# Patient Record
Sex: Female | Born: 1990
Health system: Southern US, Community
[De-identification: ages and names within clinical notes are randomized; demographics above are authoritative.]

## PROBLEM LIST (undated history)

## (undated) ENCOUNTER — Emergency Department (HOSPITAL_COMMUNITY): Discharge status: Against Medical Advice | Payer: Self-pay

## (undated) DIAGNOSIS — K5909 Other constipation: Secondary | ICD-10-CM

## (undated) DIAGNOSIS — E221 Hyperprolactinemia: Secondary | ICD-10-CM

## (undated) DIAGNOSIS — N926 Irregular menstruation, unspecified: Secondary | ICD-10-CM

## (undated) DIAGNOSIS — E049 Nontoxic goiter, unspecified: Secondary | ICD-10-CM

## (undated) HISTORY — DX: Nontoxic goiter, unspecified: E04.9

## (undated) HISTORY — DX: Hyperprolactinemia: E22.1

## (undated) HISTORY — DX: Irregular menstruation, unspecified: N92.6

## (undated) HISTORY — PX: NO PAST SURGERIES: SHX2092

## (undated) HISTORY — DX: Other constipation: K59.09

---
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
## 2011-04-15 ENCOUNTER — Ambulatory Visit (INDEPENDENT_AMBULATORY_CARE_PROVIDER_SITE_OTHER): Payer: Worker's Compensation | Admitting: Sports Medicine

## 2011-04-15 VITALS — BP 110/62 | Ht 64.0 in | Wt 126.0 lb

## 2011-04-15 DIAGNOSIS — S83006A Unspecified dislocation of unspecified patella, initial encounter: Secondary | ICD-10-CM

## 2011-04-15 DIAGNOSIS — S83003A Unspecified subluxation of unspecified patella, initial encounter: Secondary | ICD-10-CM

## 2011-04-15 NOTE — Progress Notes (Signed)
  Subjective:    Patient ID: Stacy Stevenson, female    DOB: 03/25/1990, 21 y.o.   MRN: [id number]  HPI 21 year old female presents with left knee pain x 6 weeks.  The pain started while lifting a patient a work on 03/03/11.  She reports that the knee felt like it hyperextended as she was lifting a patient.  The knee swelled and was painful.  She was evaluated at occupational health and was told to use Ibuprofen for pain.  She has been taking 400 mg Ibuprofen in the afternoon prior to going to work and this has helped some, but the knee is still somewhat swollen and painful.  She is able to walk up and down stairs without difficulty.  She does not exercise.  She has no prior injury to the left knee.     Review of Systems     Objective:   Physical Exam Gen: pleasant young woman in NAD Left knee: ttp along the medial patellar border, slight fullness of the suprapatellar pouch, decreased ROM of the left knee with almost complete extension but flexion to only 130 degrees as compared to 160 degrees on the right.  Negative Lachman's.  Intact MCL and LCL to stress testing.  +patellar apprehension.  In-office left knee ultrasound: + knee effusion with fluid tracking superiorly 4-5 cm along the quad tendon.  Irregularity of the medial patella. Patellar and quad tendons normal;  Meniscus normal. Comparison on RT knee shows no effusion.     Assessment & Plan:  21 year old female with left patellar subluxation and injury on 03/03/11 with persistent knee effusion.  Plan: - Compression with body helix knee sleeve when ambulating - Continue Ibuprofen - Straight leg raises and isometric quad flexion exercises given - Recheck in 4 weeks

## 2011-04-15 NOTE — Assessment & Plan Note (Signed)
See description  This patient is not at MMI Still with effusions Able to work but needs to be cautious with knee bend or too extreme lifting  Reck in 4 weeks

## 2011-05-13 ENCOUNTER — Ambulatory Visit (INDEPENDENT_AMBULATORY_CARE_PROVIDER_SITE_OTHER): Payer: Worker's Compensation | Admitting: Sports Medicine

## 2011-05-13 VITALS — BP 116/70

## 2011-05-13 DIAGNOSIS — S83006A Unspecified dislocation of unspecified patella, initial encounter: Secondary | ICD-10-CM

## 2011-05-13 DIAGNOSIS — S83003A Unspecified subluxation of unspecified patella, initial encounter: Secondary | ICD-10-CM

## 2011-05-13 NOTE — Assessment & Plan Note (Signed)
Today the ultrasound has returned to normal with the exception of the slight patellar irregularity  She may be at higher risk of patellar subluxation because her retropatellar groove is very shallow  Continue quadriceps exercises and some biking  She is at a point of maximum medical improvement and can be released to all work and recreational activities  Return to sports medicine when necessary

## 2011-05-13 NOTE — Patient Instructions (Addendum)
No sign of effusion or persistent injury today.  You are released for full activity and work duties.  Based on ultrasound and examination you are at the point of maximum medical improvement from previous injury.  If your knee bothers you it is ok to wear your knee sleeve for a couple of hours at a time.   Biking is good for your knees.  Thank you for seeing [place] today.  Please follow up as needed.

## 2011-05-13 NOTE — Progress Notes (Signed)
Date of injury is 03/03/2011 Mechanism of injury is hyperextension of the left knee while lifting a patient  Pt here today to followup with her left knee pain which she states is 100% better. She states she has no pain at all during the day.  She did use the compression sleeve for one week and saw less swelling in her knees but did get some swelling in her ankles so she stopped using it  She has done some of her exercises and has been careful at work about lifting  She just worked a double shift without knee pain or swelling     Physical examination   No acute distress   Lt knee Exam:  Tight Lachman Ligaments stable Neg McMurray's  Full flexion and extension No apprehension sign on patella Patella non tender to motion No effusion noted  Musculoskeletal ultrasound Swelling that was noted in the left suprapatellar pouch has resolved Compared to the right side there is no increase Menisci are normal Patellar and quadriceps tendons are normal

## 2012-04-30 ENCOUNTER — Emergency Department (INDEPENDENT_AMBULATORY_CARE_PROVIDER_SITE_OTHER)
Admission: EM | Admit: 2012-04-30 | Discharge: 2012-04-30 | Disposition: A | Discharge status: Home / Self Care | Payer: Self-pay | Source: Home / Self Care | Attending: Emergency Medicine | Admitting: Emergency Medicine

## 2012-04-30 ENCOUNTER — Encounter (HOSPITAL_COMMUNITY): Payer: Self-pay | Admitting: Emergency Medicine

## 2012-04-30 DIAGNOSIS — B353 Tinea pedis: Secondary | ICD-10-CM

## 2012-04-30 DIAGNOSIS — L0390 Cellulitis, unspecified: Secondary | ICD-10-CM

## 2012-04-30 MED ORDER — SULFAMETHOXAZOLE-TMP DS 800-160 MG PO TABS: 1.0000 | ORAL_TABLET | Freq: Two times a day (BID) | ORAL | Status: DC

## 2012-04-30 MED ORDER — HYDROCODONE-ACETAMINOPHEN 5-325 MG PO TABS: ORAL_TABLET | ORAL | Status: DC

## 2012-04-30 MED ORDER — IBUPROFEN 800 MG PO TABS
ORAL_TABLET | ORAL | Status: AC
  Filled 2012-04-30: qty 1

## 2012-04-30 MED ORDER — TERBINAFINE HCL 1 % EX CREA: TOPICAL_CREAM | Freq: Two times a day (BID) | CUTANEOUS | Status: DC

## 2012-04-30 MED ORDER — IBUPROFEN 800 MG PO TABS
800.0000 mg | ORAL_TABLET | Freq: Once | ORAL | Status: AC
  Administered 2012-04-30: 800 mg via ORAL

## 2012-04-30 MED ORDER — CEFTRIAXONE SODIUM 1 G IJ SOLR
1.0000 g | Freq: Once | INTRAMUSCULAR | Status: AC
  Administered 2012-04-30: 1 g via INTRAMUSCULAR

## 2012-04-30 MED ORDER — HYDROCODONE-ACETAMINOPHEN 5-325 MG PO TABS
ORAL_TABLET | ORAL | Status: AC
  Filled 2012-04-30: qty 1

## 2012-04-30 MED ORDER — HYDROCODONE-ACETAMINOPHEN 5-325 MG PO TABS
1.0000 | ORAL_TABLET | Freq: Once | ORAL | Status: AC
  Administered 2012-04-30: 1 via ORAL

## 2012-04-30 MED ORDER — CEPHALEXIN 500 MG PO CAPS: 500.0000 mg | ORAL_CAPSULE | Freq: Three times a day (TID) | ORAL | Status: DC

## 2012-04-30 MED ORDER — CEFTRIAXONE SODIUM 1 G IJ SOLR
INTRAMUSCULAR | Status: AC
  Filled 2012-04-30: qty 10

## 2012-04-30 MED ORDER — LIDOCAINE HCL (PF) 1 % IJ SOLN
INTRAMUSCULAR | Status: AC
  Filled 2012-04-30: qty 5

## 2012-04-30 NOTE — ED Notes (Signed)
Pain and swelling foot for couple of days, sore between toes

## 2012-04-30 NOTE — ED Provider Notes (Signed)
Chief Complaint:   Chief Complaint  Patient presents with  . Recurrent Skin Infections    History of Present Illness:   Stacy Stevenson is a 22 year old female who has had swelling, erythema, heat, and tenderness over the dorsum of the left foot since yesterday. She has an athlete's foot-type infection between the web spaces of her second, third, and fourth toes. There is some cracking of the skin. She has a blister on her middle toe, and it seems to be the source of the infection. There's been no purulent drainage. She denies any fever, chills, or extension above the ankle. She has had athlete's foot before, but never had an infection like this.  Review of Systems:  Other than noted above, the patient denies any of the following symptoms: Systemic:  No fevers, chills, sweats, or aches.  No fatigue or tiredness. Musculoskeletal:  No joint pain, arthritis, bursitis, swelling, back pain, or neck pain. Neurological:  No muscular weakness, paresthesias, headache, or trouble with speech or coordination.  No dizziness.  PMFSH:  Past medical history, family history, social history, meds, and allergies were reviewed.  She takes oral birth control pills.  Physical Exam:   Vital signs:  BP 127/80  Pulse 85  Temp(Src) 98 F (36.7 C) (Oral)  Resp 16  SpO2 100% Gen:  Alert and oriented times 3.  In no distress. Musculoskeletal: There is erythema, swelling, and tenderness to palpation of the dorsum of the foot measuring 5 cm x 8 cm. There is a blister on the middle toe. She has maceration of the skin between the second, third, and fourth toes.  Otherwise, all joints had a full a ROM with no swelling, bruising or deformity.  No edema, pulses full. Extremities were warm and pink.  Capillary refill was brisk.  Skin:  Clear, warm and dry.  No rash. Neuro:  Alert and oriented times 3.  Muscle strength was normal.  Sensation was intact to light touch.        Procedure Note:  Verbal informed consent was  obtained from the patient.  Risks and benefits were outlined with the patient.  Patient understands and accepts these risks.  Identity of the patient was confirmed verbally and by armband.    Procedure was performed as follows:  The area of erythema was marked with a dermatographia plan the blister on the middle toe was prepped with alcohol and incised with a #11 needle. A small amount of clear, nonbloody, non-purulent fluid was obtained which was cultured. Antibiotic ointment was applied.  Patient tolerated the procedure well without any immediate complications.  Course in Urgent Care Center:   Was given Rocephin 1 g IM, Norco 5/325 one by mouth, and ibuprofen 800 mg by mouth.  Assessment:  The primary encounter diagnosis was Tinea pedis. A diagnosis of Cellulitis was also pertinent to this visit.  Plan:   1.  The following meds were prescribed:   Discharge Medication List as of 04/30/2012  7:38 PM    START taking these medications   Details  cephALEXin (KEFLEX) 500 MG capsule Take 1 capsule (500 mg total) by mouth 3 (three) times daily., Starting 04/30/2012, Until Discontinued, Normal    sulfamethoxazole-trimethoprim (BACTRIM DS) 800-160 MG per tablet Take 1 tablet by mouth 2 (two) times daily., Starting 04/30/2012, Until Discontinued, Normal    terbinafine (LAMISIL) 1 % cream Apply topically 2 (two) times daily., Starting 04/30/2012, Until Discontinued, Normal    HYDROcodone-acetaminophen (NORCO/VICODIN) 5-325 MG per tablet 1 to 2  tabs every 4 to 6 hours as needed for pain., Print       2.  The patient was instructed in symptomatic care, including rest and activity, elevation, application of ice and compression.  Appropriate handouts were given. 3.  The patient was told to return if becoming worse in any way, for a scheduled recheck in 48 hours, and given some red flag symptoms such as fever, worsening pain, or a red streak extending up the leg that would indicate earlier return.   4.  The  patient was told to follow up in 48 hours.    Reuben Likes, MD 04/30/12 [id number]

## 2012-05-02 ENCOUNTER — Encounter (HOSPITAL_COMMUNITY): Payer: Self-pay | Admitting: Emergency Medicine

## 2012-05-02 ENCOUNTER — Emergency Department (INDEPENDENT_AMBULATORY_CARE_PROVIDER_SITE_OTHER)
Admission: EM | Admit: 2012-05-02 | Discharge: 2012-05-02 | Disposition: A | Discharge status: Home / Self Care | Payer: Self-pay | Source: Home / Self Care | Attending: Family Medicine | Admitting: Family Medicine

## 2012-05-02 DIAGNOSIS — L0291 Cutaneous abscess, unspecified: Secondary | ICD-10-CM

## 2012-05-02 DIAGNOSIS — L0390 Cellulitis, unspecified: Secondary | ICD-10-CM

## 2012-05-02 NOTE — ED Notes (Signed)
Pt in for follow up of sore between toes of the left foot.  Pt voices no concerns. Area is feeling better

## 2012-05-02 NOTE — ED Provider Notes (Signed)
Chief Complaint:   Chief Complaint  Patient presents with  . Wound Check    follow of sore between toes of the left foot.     History of Present Illness:   Stacy Stevenson is a 22 year old female who returns for scheduled followup on cellulitis of the foot secondary to an athlete's foot infection. She was seen here 48 hours ago. She had a small blister on her middle toe and this was popped. The lesion was cultured and so far is not growing out anything. She was placed on cephalexin and Septra. The swelling, redness, and pain have all gone down. She's not had any fever. There still some maceration in between her toes and she is applying terbinafine cream.  Review of Systems:  Other than noted above, the patient denies any of the following symptoms: Systemic:  No fevers, chills, sweats, or aches.  No fatigue or tiredness. Musculoskeletal:  No joint pain, arthritis, bursitis, swelling, back pain, or neck pain. Neurological:  No muscular weakness, paresthesias, headache, or trouble with speech or coordination.  No dizziness.  PMFSH:  Past medical history, family history, social history, meds, and allergies were reviewed.   Physical Exam:   Vital signs:  BP 125/83  Pulse 81  Temp(Src) 99.6 F (37.6 C) (Oral)  Resp 17  SpO2 99%  LMP 04/01/2012 Gen:  Alert and oriented times 3.  In no distress. Musculoskeletal: Full a bit better. There is no swelling, erythema, or tenderness to palpation. She still does have some maceration of the skin between her toes.  Otherwise, all joints had a full a ROM with no swelling, bruising or deformity.  No edema, pulses full. Extremities were warm and pink.  Capillary refill was brisk.  Skin:  Clear, warm and dry.  No rash. Neuro:  Alert and oriented times 3.  Muscle strength was normal.  Sensation was intact to light touch.   Course in Urgent Care Center:   Some of the dead skin from the blister was no cough. Antibiotic ointment was applied and a Telfa dressing.  She was instructed in wound care.  Assessment:  The encounter diagnosis was Cellulitis.  Plan:   1.  The following meds were prescribed:   Discharge Medication List as of 05/02/2012  3:43 PM     She was told to finish up the antibiotics that she was given at her last visit.  2.  The patient was instructed in symptomatic care, including rest and activity, elevation, application of ice and compression.  Appropriate handouts were given. 3.  The patient was told to return if becoming worse in any way, if no better in 3 or 4 days, and given some red flag symptoms such as increasing pain, swelling, or fever that would indicate earlier return.   4.  The patient was told to follow up as needed if any worsening.    Reuben Likes, MD 05/02/12 [phone]

## 2012-05-03 LAB — CULTURE, ROUTINE-ABSCESS: Gram Stain: NONE SEEN

## 2012-05-05 ENCOUNTER — Telehealth (HOSPITAL_COMMUNITY): Payer: Self-pay | Admitting: *Deleted

## 2012-05-05 NOTE — ED Notes (Signed)
Abscess culture L foot: Mod. MRSA.  I called pt.  Pt. verified x 2 and given results.   Pt. told she was adequately treated with Bactrim DS. Pt. given the [facility] MRSA instructions. Pt. voiced understanding. She asked where she got it.  I told her I did not know.  She told me about getting something on her foot 2 yrs ago [place]. I told her that could have been when she got it, but not sure.  Vassie Moselle 05/05/2012

## 2013-10-25 ENCOUNTER — Other Ambulatory Visit (HOSPITAL_COMMUNITY)
Admission: RE | Admit: 2013-10-25 | Discharge: 2013-10-25 | Disposition: A | Discharge status: Home / Self Care | Payer: 59 | Source: Ambulatory Visit | Attending: Family Medicine | Admitting: Family Medicine

## 2013-10-25 ENCOUNTER — Other Ambulatory Visit: Payer: Self-pay | Admitting: Family Medicine

## 2013-10-25 DIAGNOSIS — Z01419 Encounter for gynecological examination (general) (routine) without abnormal findings: Secondary | ICD-10-CM | POA: Insufficient documentation

## 2013-10-27 LAB — CYTOLOGY - PAP

## 2014-05-16 ENCOUNTER — Ambulatory Visit
Admission: RE | Admit: 2014-05-16 | Discharge: 2014-05-16 | Disposition: A | Discharge status: Home / Self Care | Payer: 59 | Source: Ambulatory Visit | Attending: Family Medicine | Admitting: Family Medicine

## 2014-05-16 ENCOUNTER — Other Ambulatory Visit: Payer: Self-pay | Admitting: Family Medicine

## 2014-05-16 DIAGNOSIS — K5900 Constipation, unspecified: Secondary | ICD-10-CM

## 2014-06-13 ENCOUNTER — Other Ambulatory Visit: Payer: Self-pay | Admitting: Family Medicine

## 2014-06-13 DIAGNOSIS — N6310 Unspecified lump in the right breast, unspecified quadrant: Secondary | ICD-10-CM

## 2014-06-15 ENCOUNTER — Ambulatory Visit
Admission: RE | Admit: 2014-06-15 | Discharge: 2014-06-15 | Disposition: A | Discharge status: Home / Self Care | Payer: 59 | Source: Ambulatory Visit | Attending: Family Medicine | Admitting: Family Medicine

## 2014-06-15 DIAGNOSIS — N6310 Unspecified lump in the right breast, unspecified quadrant: Secondary | ICD-10-CM

## 2014-11-14 ENCOUNTER — Other Ambulatory Visit (HOSPITAL_COMMUNITY): Payer: Self-pay | Admitting: Family Medicine

## 2014-11-14 DIAGNOSIS — E049 Nontoxic goiter, unspecified: Secondary | ICD-10-CM

## 2014-11-20 ENCOUNTER — Other Ambulatory Visit (HOSPITAL_COMMUNITY): Payer: Self-pay

## 2014-11-21 ENCOUNTER — Ambulatory Visit (HOSPITAL_COMMUNITY)
Admission: RE | Admit: 2014-11-21 | Discharge: 2014-11-21 | Disposition: A | Discharge status: Home / Self Care | Payer: 59 | Source: Ambulatory Visit | Attending: Family Medicine | Admitting: Family Medicine

## 2014-11-21 DIAGNOSIS — E041 Nontoxic single thyroid nodule: Secondary | ICD-10-CM | POA: Insufficient documentation

## 2014-11-21 DIAGNOSIS — E049 Nontoxic goiter, unspecified: Secondary | ICD-10-CM | POA: Diagnosis not present

## 2015-03-30 DIAGNOSIS — L731 Pseudofolliculitis barbae: Secondary | ICD-10-CM | POA: Diagnosis not present

## 2015-03-30 DIAGNOSIS — L309 Dermatitis, unspecified: Secondary | ICD-10-CM | POA: Diagnosis not present

## 2015-04-19 DIAGNOSIS — Z309 Encounter for contraceptive management, unspecified: Secondary | ICD-10-CM | POA: Diagnosis not present

## 2015-04-19 DIAGNOSIS — J029 Acute pharyngitis, unspecified: Secondary | ICD-10-CM | POA: Diagnosis not present

## 2015-04-25 DIAGNOSIS — Z7251 High risk heterosexual behavior: Secondary | ICD-10-CM | POA: Diagnosis not present

## 2015-04-25 DIAGNOSIS — Z113 Encounter for screening for infections with a predominantly sexual mode of transmission: Secondary | ICD-10-CM | POA: Diagnosis not present

## 2015-05-15 DIAGNOSIS — R109 Unspecified abdominal pain: Secondary | ICD-10-CM | POA: Diagnosis not present

## 2015-05-15 DIAGNOSIS — K5900 Constipation, unspecified: Secondary | ICD-10-CM | POA: Diagnosis not present

## 2015-07-27 DIAGNOSIS — Z113 Encounter for screening for infections with a predominantly sexual mode of transmission: Secondary | ICD-10-CM | POA: Diagnosis not present

## 2015-07-27 DIAGNOSIS — Z1151 Encounter for screening for human papillomavirus (HPV): Secondary | ICD-10-CM | POA: Diagnosis not present

## 2015-07-27 DIAGNOSIS — Z3040 Encounter for surveillance of contraceptives, unspecified: Secondary | ICD-10-CM | POA: Diagnosis not present

## 2015-07-27 DIAGNOSIS — Z124 Encounter for screening for malignant neoplasm of cervix: Secondary | ICD-10-CM | POA: Diagnosis not present

## 2015-07-27 DIAGNOSIS — Z6827 Body mass index (BMI) 27.0-27.9, adult: Secondary | ICD-10-CM | POA: Diagnosis not present

## 2015-07-27 DIAGNOSIS — Z130 Encounter for screening for diseases of the blood and blood-forming organs and certain disorders involving the immune mechanism: Secondary | ICD-10-CM | POA: Diagnosis not present

## 2015-07-27 DIAGNOSIS — Z1389 Encounter for screening for other disorder: Secondary | ICD-10-CM | POA: Diagnosis not present

## 2015-07-27 DIAGNOSIS — Z01419 Encounter for gynecological examination (general) (routine) without abnormal findings: Secondary | ICD-10-CM | POA: Diagnosis not present

## 2015-07-27 DIAGNOSIS — N926 Irregular menstruation, unspecified: Secondary | ICD-10-CM | POA: Diagnosis not present

## 2015-08-16 DIAGNOSIS — R51 Headache: Secondary | ICD-10-CM | POA: Diagnosis not present

## 2015-08-22 DIAGNOSIS — R51 Headache: Secondary | ICD-10-CM | POA: Diagnosis not present

## 2015-09-25 DIAGNOSIS — Z113 Encounter for screening for infections with a predominantly sexual mode of transmission: Secondary | ICD-10-CM | POA: Diagnosis not present

## 2015-10-05 ENCOUNTER — Other Ambulatory Visit: Payer: Self-pay | Admitting: Obstetrics and Gynecology

## 2015-10-05 DIAGNOSIS — R947 Abnormal results of other endocrine function studies: Secondary | ICD-10-CM

## 2015-10-08 ENCOUNTER — Ambulatory Visit
Admission: RE | Admit: 2015-10-08 | Discharge: 2015-10-08 | Disposition: A | Discharge status: Home / Self Care | Payer: 59 | Source: Ambulatory Visit | Attending: Obstetrics and Gynecology | Admitting: Obstetrics and Gynecology

## 2015-10-08 DIAGNOSIS — R930 Abnormal findings on diagnostic imaging of skull and head, not elsewhere classified: Secondary | ICD-10-CM | POA: Diagnosis not present

## 2015-10-08 DIAGNOSIS — R947 Abnormal results of other endocrine function studies: Secondary | ICD-10-CM

## 2015-10-08 MED ORDER — GADOBENATE DIMEGLUMINE 529 MG/ML IV SOLN
10.0000 mL | Freq: Once | INTRAVENOUS | Status: AC | PRN
  Administered 2015-10-08: 10 mL via INTRAVENOUS

## 2015-10-16 DIAGNOSIS — R05 Cough: Secondary | ICD-10-CM | POA: Diagnosis not present

## 2015-11-14 DIAGNOSIS — Z0000 Encounter for general adult medical examination without abnormal findings: Secondary | ICD-10-CM | POA: Diagnosis not present

## 2015-11-19 DIAGNOSIS — N926 Irregular menstruation, unspecified: Secondary | ICD-10-CM | POA: Diagnosis not present

## 2015-11-22 DIAGNOSIS — H52223 Regular astigmatism, bilateral: Secondary | ICD-10-CM | POA: Diagnosis not present

## 2015-11-22 DIAGNOSIS — H5213 Myopia, bilateral: Secondary | ICD-10-CM | POA: Diagnosis not present

## 2015-12-12 DIAGNOSIS — E049 Nontoxic goiter, unspecified: Secondary | ICD-10-CM | POA: Diagnosis not present

## 2015-12-12 DIAGNOSIS — E221 Hyperprolactinemia: Secondary | ICD-10-CM | POA: Diagnosis not present

## 2015-12-13 ENCOUNTER — Other Ambulatory Visit (HOSPITAL_COMMUNITY): Payer: Self-pay | Admitting: Endocrinology

## 2015-12-13 DIAGNOSIS — E049 Nontoxic goiter, unspecified: Secondary | ICD-10-CM

## 2015-12-17 ENCOUNTER — Ambulatory Visit (HOSPITAL_COMMUNITY)
Admission: RE | Admit: 2015-12-17 | Discharge: 2015-12-17 | Disposition: A | Discharge status: Home / Self Care | Payer: 59 | Source: Ambulatory Visit | Attending: Endocrinology | Admitting: Endocrinology

## 2015-12-17 DIAGNOSIS — E042 Nontoxic multinodular goiter: Secondary | ICD-10-CM | POA: Diagnosis not present

## 2015-12-17 DIAGNOSIS — E049 Nontoxic goiter, unspecified: Secondary | ICD-10-CM

## 2016-02-18 DIAGNOSIS — R947 Abnormal results of other endocrine function studies: Secondary | ICD-10-CM | POA: Diagnosis not present

## 2016-03-10 DIAGNOSIS — R05 Cough: Secondary | ICD-10-CM | POA: Diagnosis not present

## 2016-03-25 DIAGNOSIS — Z3009 Encounter for other general counseling and advice on contraception: Secondary | ICD-10-CM | POA: Diagnosis not present

## 2016-04-07 ENCOUNTER — Ambulatory Visit
Admission: RE | Admit: 2016-04-07 | Discharge: 2016-04-07 | Disposition: A | Discharge status: Home / Self Care | Payer: 59 | Source: Ambulatory Visit | Attending: Family Medicine | Admitting: Family Medicine

## 2016-04-07 ENCOUNTER — Other Ambulatory Visit: Payer: Self-pay | Admitting: Family Medicine

## 2016-04-07 DIAGNOSIS — R05 Cough: Secondary | ICD-10-CM | POA: Diagnosis not present

## 2016-04-07 DIAGNOSIS — R053 Chronic cough: Secondary | ICD-10-CM

## 2016-04-10 DIAGNOSIS — M940 Chondrocostal junction syndrome [Tietze]: Secondary | ICD-10-CM | POA: Diagnosis not present

## 2016-04-10 DIAGNOSIS — J42 Unspecified chronic bronchitis: Secondary | ICD-10-CM | POA: Diagnosis not present

## 2016-04-10 DIAGNOSIS — J3089 Other allergic rhinitis: Secondary | ICD-10-CM | POA: Diagnosis not present

## 2016-06-03 DIAGNOSIS — K5900 Constipation, unspecified: Secondary | ICD-10-CM | POA: Diagnosis not present

## 2016-06-03 DIAGNOSIS — R05 Cough: Secondary | ICD-10-CM | POA: Diagnosis not present

## 2016-06-05 ENCOUNTER — Encounter: Payer: Self-pay | Admitting: Gastroenterology

## 2016-06-16 DIAGNOSIS — E221 Hyperprolactinemia: Secondary | ICD-10-CM | POA: Diagnosis not present

## 2016-06-26 DIAGNOSIS — Z3200 Encounter for pregnancy test, result unknown: Secondary | ICD-10-CM | POA: Diagnosis not present

## 2016-07-02 DIAGNOSIS — Z3201 Encounter for pregnancy test, result positive: Secondary | ICD-10-CM | POA: Diagnosis not present

## 2016-07-02 DIAGNOSIS — N911 Secondary amenorrhea: Secondary | ICD-10-CM | POA: Diagnosis not present

## 2016-07-10 ENCOUNTER — Ambulatory Visit: Payer: Self-pay | Admitting: Gastroenterology

## 2016-07-23 DIAGNOSIS — Z3401 Encounter for supervision of normal first pregnancy, first trimester: Secondary | ICD-10-CM | POA: Diagnosis not present

## 2016-07-23 DIAGNOSIS — Z3A10 10 weeks gestation of pregnancy: Secondary | ICD-10-CM | POA: Diagnosis not present

## 2016-07-23 DIAGNOSIS — Z113 Encounter for screening for infections with a predominantly sexual mode of transmission: Secondary | ICD-10-CM | POA: Diagnosis not present

## 2016-07-23 DIAGNOSIS — O200 Threatened abortion: Secondary | ICD-10-CM | POA: Diagnosis not present

## 2016-07-23 DIAGNOSIS — Z3689 Encounter for other specified antenatal screening: Secondary | ICD-10-CM | POA: Diagnosis not present

## 2016-07-23 DIAGNOSIS — O26891 Other specified pregnancy related conditions, first trimester: Secondary | ICD-10-CM | POA: Diagnosis not present

## 2016-07-23 LAB — OB RESULTS CONSOLE RPR: RPR: NONREACTIVE

## 2016-07-23 LAB — OB RESULTS CONSOLE ABO/RH: RH TYPE: POSITIVE

## 2016-07-23 LAB — OB RESULTS CONSOLE ANTIBODY SCREEN: Antibody Screen: NEGATIVE

## 2016-07-23 LAB — OB RESULTS CONSOLE GC/CHLAMYDIA
Chlamydia: NEGATIVE
Gonorrhea: NEGATIVE

## 2016-07-23 LAB — OB RESULTS CONSOLE HIV ANTIBODY (ROUTINE TESTING): HIV: NONREACTIVE

## 2016-07-23 LAB — OB RESULTS CONSOLE HEPATITIS B SURFACE ANTIGEN: HEP B S AG: NEGATIVE

## 2016-07-23 LAB — OB RESULTS CONSOLE RUBELLA ANTIBODY, IGM: Rubella: IMMUNE

## 2016-07-31 ENCOUNTER — Institutional Professional Consult (permissible substitution): Payer: Self-pay | Admitting: Internal Medicine

## 2016-08-01 DIAGNOSIS — Z3A12 12 weeks gestation of pregnancy: Secondary | ICD-10-CM | POA: Diagnosis not present

## 2016-08-01 DIAGNOSIS — Z3682 Encounter for antenatal screening for nuchal translucency: Secondary | ICD-10-CM | POA: Diagnosis not present

## 2016-09-15 DIAGNOSIS — Z3A18 18 weeks gestation of pregnancy: Secondary | ICD-10-CM | POA: Diagnosis not present

## 2016-09-15 DIAGNOSIS — O3412 Maternal care for benign tumor of corpus uteri, second trimester: Secondary | ICD-10-CM | POA: Diagnosis not present

## 2016-09-15 DIAGNOSIS — Z363 Encounter for antenatal screening for malformations: Secondary | ICD-10-CM | POA: Diagnosis not present

## 2016-10-07 DIAGNOSIS — E049 Nontoxic goiter, unspecified: Secondary | ICD-10-CM | POA: Diagnosis not present

## 2016-10-07 DIAGNOSIS — E221 Hyperprolactinemia: Secondary | ICD-10-CM | POA: Diagnosis not present

## 2016-10-07 DIAGNOSIS — Z331 Pregnant state, incidental: Secondary | ICD-10-CM | POA: Diagnosis not present

## 2016-10-16 DIAGNOSIS — Z3A23 23 weeks gestation of pregnancy: Secondary | ICD-10-CM | POA: Diagnosis not present

## 2016-10-16 DIAGNOSIS — O368390 Maternal care for abnormalities of the fetal heart rate or rhythm, unspecified trimester, not applicable or unspecified: Secondary | ICD-10-CM | POA: Diagnosis not present

## 2016-11-10 DIAGNOSIS — Z3492 Encounter for supervision of normal pregnancy, unspecified, second trimester: Secondary | ICD-10-CM | POA: Diagnosis not present

## 2016-11-10 DIAGNOSIS — Z3A26 26 weeks gestation of pregnancy: Secondary | ICD-10-CM | POA: Diagnosis not present

## 2016-11-10 DIAGNOSIS — O368320 Maternal care for abnormalities of the fetal heart rate or rhythm, second trimester, not applicable or unspecified: Secondary | ICD-10-CM | POA: Diagnosis not present

## 2016-11-20 DIAGNOSIS — Z3689 Encounter for other specified antenatal screening: Secondary | ICD-10-CM | POA: Diagnosis not present

## 2016-12-03 ENCOUNTER — Other Ambulatory Visit: Payer: Self-pay | Admitting: Endocrinology

## 2016-12-03 DIAGNOSIS — E221 Hyperprolactinemia: Secondary | ICD-10-CM

## 2017-01-12 DIAGNOSIS — Z3403 Encounter for supervision of normal first pregnancy, third trimester: Secondary | ICD-10-CM | POA: Diagnosis not present

## 2017-01-12 DIAGNOSIS — Z3A35 35 weeks gestation of pregnancy: Secondary | ICD-10-CM | POA: Diagnosis not present

## 2017-01-12 DIAGNOSIS — Z3685 Encounter for antenatal screening for Streptococcus B: Secondary | ICD-10-CM | POA: Diagnosis not present

## 2017-01-12 LAB — OB RESULTS CONSOLE GBS: STREP GROUP B AG: POSITIVE

## 2017-01-19 DIAGNOSIS — Z3403 Encounter for supervision of normal first pregnancy, third trimester: Secondary | ICD-10-CM | POA: Diagnosis not present

## 2017-01-19 DIAGNOSIS — R8290 Unspecified abnormal findings in urine: Secondary | ICD-10-CM | POA: Diagnosis not present

## 2017-01-19 DIAGNOSIS — Z3A36 36 weeks gestation of pregnancy: Secondary | ICD-10-CM | POA: Diagnosis not present

## 2017-02-10 ENCOUNTER — Telehealth (HOSPITAL_COMMUNITY): Payer: Self-pay | Admitting: *Deleted

## 2017-02-10 ENCOUNTER — Encounter (HOSPITAL_COMMUNITY): Payer: Self-pay | Admitting: *Deleted

## 2017-02-10 NOTE — Telephone Encounter (Signed)
Preadmission screen  

## 2017-02-12 ENCOUNTER — Inpatient Hospital Stay (HOSPITAL_COMMUNITY): Admission: AD | Admit: 2017-02-12 | Payer: 59 | Source: Ambulatory Visit | Admitting: Obstetrics and Gynecology

## 2017-02-14 ENCOUNTER — Encounter (HOSPITAL_COMMUNITY): Payer: Self-pay

## 2017-02-14 ENCOUNTER — Inpatient Hospital Stay (HOSPITAL_COMMUNITY): Payer: 59 | Admitting: Anesthesiology

## 2017-02-14 ENCOUNTER — Inpatient Hospital Stay (HOSPITAL_COMMUNITY)
Admission: RE | Admit: 2017-02-14 | Discharge: 2017-02-17 | DRG: 787 | Disposition: A | Discharge status: Home / Self Care | Payer: 59 | Source: Ambulatory Visit | Attending: Obstetrics and Gynecology | Admitting: Obstetrics and Gynecology

## 2017-02-14 DIAGNOSIS — Z3A00 Weeks of gestation of pregnancy not specified: Secondary | ICD-10-CM | POA: Diagnosis not present

## 2017-02-14 DIAGNOSIS — D573 Sickle-cell trait: Secondary | ICD-10-CM | POA: Diagnosis present

## 2017-02-14 DIAGNOSIS — O3413 Maternal care for benign tumor of corpus uteri, third trimester: Secondary | ICD-10-CM | POA: Diagnosis present

## 2017-02-14 DIAGNOSIS — O99824 Streptococcus B carrier state complicating childbirth: Secondary | ICD-10-CM | POA: Diagnosis present

## 2017-02-14 DIAGNOSIS — D259 Leiomyoma of uterus, unspecified: Secondary | ICD-10-CM | POA: Diagnosis present

## 2017-02-14 DIAGNOSIS — Z3A40 40 weeks gestation of pregnancy: Secondary | ICD-10-CM | POA: Diagnosis not present

## 2017-02-14 DIAGNOSIS — O9902 Anemia complicating childbirth: Principal | ICD-10-CM | POA: Diagnosis present

## 2017-02-14 DIAGNOSIS — Z98891 History of uterine scar from previous surgery: Secondary | ICD-10-CM

## 2017-02-14 DIAGNOSIS — Z3483 Encounter for supervision of other normal pregnancy, third trimester: Secondary | ICD-10-CM | POA: Diagnosis present

## 2017-02-14 LAB — TYPE AND SCREEN
ABO/RH(D): O POS
ANTIBODY SCREEN: NEGATIVE

## 2017-02-14 LAB — CBC
HEMATOCRIT: 33.1 % — AB (ref 36.0–46.0)
Hemoglobin: 11 g/dL — ABNORMAL LOW (ref 12.0–15.0)
MCH: 24.7 pg — ABNORMAL LOW (ref 26.0–34.0)
MCHC: 33.2 g/dL (ref 30.0–36.0)
MCV: 74.2 fL — ABNORMAL LOW (ref 78.0–100.0)
Platelets: 261 10*3/uL (ref 150–400)
RBC: 4.46 MIL/uL (ref 3.87–5.11)
RDW: 16.4 % — AB (ref 11.5–15.5)
WBC: 10 10*3/uL (ref 4.0–10.5)

## 2017-02-14 LAB — ABO/RH: ABO/RH(D): O POS

## 2017-02-14 MED ORDER — OXYTOCIN 40 UNITS IN LACTATED RINGERS INFUSION - SIMPLE MED: 2.5000 [IU]/h | INTRAVENOUS | Status: DC

## 2017-02-14 MED ORDER — DIPHENHYDRAMINE HCL 50 MG/ML IJ SOLN: 12.5000 mg | INTRAMUSCULAR | Status: DC | PRN

## 2017-02-14 MED ORDER — LACTATED RINGERS IV SOLN
500.0000 mL | Freq: Once | INTRAVENOUS | Status: AC
  Administered 2017-02-14: 500 mL via INTRAVENOUS

## 2017-02-14 MED ORDER — EPHEDRINE 5 MG/ML INJ
10.0000 mg | INTRAVENOUS | Status: DC | PRN
  Filled 2017-02-14: qty 2

## 2017-02-14 MED ORDER — SOD CITRATE-CITRIC ACID 500-334 MG/5ML PO SOLN
30.0000 mL | ORAL | Status: DC | PRN
  Administered 2017-02-15: 30 mL via ORAL
  Filled 2017-02-14: qty 15

## 2017-02-14 MED ORDER — TERBUTALINE SULFATE 1 MG/ML IJ SOLN
0.2500 mg | Freq: Once | INTRAMUSCULAR | Status: DC | PRN
  Filled 2017-02-14: qty 1

## 2017-02-14 MED ORDER — FENTANYL CITRATE (PF) 100 MCG/2ML IJ SOLN
100.0000 ug | Freq: Once | INTRAMUSCULAR | Status: AC
  Administered 2017-02-14: 100 ug via INTRAVENOUS
  Filled 2017-02-14: qty 2

## 2017-02-14 MED ORDER — MISOPROSTOL 25 MCG QUARTER TABLET
25.0000 ug | ORAL_TABLET | ORAL | Status: DC | PRN
  Administered 2017-02-14: 25 ug via VAGINAL
  Filled 2017-02-14 (×2): qty 1

## 2017-02-14 MED ORDER — BUTORPHANOL TARTRATE 1 MG/ML IJ SOLN
1.0000 mg | INTRAMUSCULAR | Status: DC | PRN
  Administered 2017-02-14: 1 mg via INTRAVENOUS
  Filled 2017-02-14: qty 1

## 2017-02-14 MED ORDER — OXYTOCIN 40 UNITS IN LACTATED RINGERS INFUSION - SIMPLE MED
1.0000 m[IU]/min | INTRAVENOUS | Status: DC
Start: 2017-02-14 — End: 2017-02-15
  Administered 2017-02-14: 2 m[IU]/min via INTRAVENOUS
  Filled 2017-02-14: qty 1000

## 2017-02-14 MED ORDER — LIDOCAINE HCL (PF) 1 % IJ SOLN
INTRAMUSCULAR | Status: DC | PRN
  Administered 2017-02-14: 2 mL
  Administered 2017-02-14: 5 mL
  Administered 2017-02-14: 3 mL

## 2017-02-14 MED ORDER — LACTATED RINGERS IV SOLN: 500.0000 mL | Freq: Once | INTRAVENOUS | Status: DC

## 2017-02-14 MED ORDER — OXYTOCIN BOLUS FROM INFUSION: 500.0000 mL | Freq: Once | INTRAVENOUS | Status: DC

## 2017-02-14 MED ORDER — PENICILLIN G POTASSIUM 5000000 UNITS IJ SOLR
5.0000 10*6.[IU] | Freq: Once | INTRAVENOUS | Status: AC
  Administered 2017-02-14: 5 10*6.[IU] via INTRAVENOUS
  Filled 2017-02-14: qty 5

## 2017-02-14 MED ORDER — PHENYLEPHRINE 40 MCG/ML (10ML) SYRINGE FOR IV PUSH (FOR BLOOD PRESSURE SUPPORT)
80.0000 ug | PREFILLED_SYRINGE | INTRAVENOUS | Status: DC | PRN
Start: 2017-02-14 — End: 2017-02-15
  Administered 2017-02-14: 80 ug via INTRAVENOUS
  Filled 2017-02-14: qty 5

## 2017-02-14 MED ORDER — FENTANYL 2.5 MCG/ML BUPIVACAINE 1/10 % EPIDURAL INFUSION (WH - ANES)
14.0000 mL/h | INTRAMUSCULAR | Status: DC | PRN
  Administered 2017-02-14 – 2017-02-15 (×3): 14 mL/h via EPIDURAL
  Filled 2017-02-14 (×3): qty 100

## 2017-02-14 MED ORDER — LIDOCAINE HCL (PF) 1 % IJ SOLN
30.0000 mL | INTRAMUSCULAR | Status: DC | PRN
  Filled 2017-02-14: qty 30

## 2017-02-14 MED ORDER — LACTATED RINGERS IV SOLN
INTRAVENOUS | Status: DC
  Administered 2017-02-14: 125 mL/h via INTRAVENOUS
  Administered 2017-02-14 – 2017-02-15 (×2): via INTRAVENOUS

## 2017-02-14 MED ORDER — OXYCODONE-ACETAMINOPHEN 5-325 MG PO TABS: 1.0000 | ORAL_TABLET | ORAL | Status: DC | PRN

## 2017-02-14 MED ORDER — OXYCODONE-ACETAMINOPHEN 5-325 MG PO TABS: 2.0000 | ORAL_TABLET | ORAL | Status: DC | PRN

## 2017-02-14 MED ORDER — LACTATED RINGERS IV SOLN
500.0000 mL | INTRAVENOUS | Status: DC | PRN
  Administered 2017-02-14: 1000 mL via INTRAVENOUS
  Administered 2017-02-15 (×2): 500 mL via INTRAVENOUS

## 2017-02-14 MED ORDER — PHENYLEPHRINE 40 MCG/ML (10ML) SYRINGE FOR IV PUSH (FOR BLOOD PRESSURE SUPPORT)
80.0000 ug | PREFILLED_SYRINGE | INTRAVENOUS | Status: DC | PRN
  Filled 2017-02-14: qty 5

## 2017-02-14 MED ORDER — ONDANSETRON HCL 4 MG/2ML IJ SOLN
4.0000 mg | Freq: Four times a day (QID) | INTRAMUSCULAR | Status: DC | PRN
  Filled 2017-02-14: qty 2

## 2017-02-14 MED ORDER — PHENYLEPHRINE 40 MCG/ML (10ML) SYRINGE FOR IV PUSH (FOR BLOOD PRESSURE SUPPORT)
80.0000 ug | PREFILLED_SYRINGE | INTRAVENOUS | Status: DC | PRN
  Filled 2017-02-14: qty 10
  Filled 2017-02-14: qty 5

## 2017-02-14 MED ORDER — ACETAMINOPHEN 325 MG PO TABS: 650.0000 mg | ORAL_TABLET | ORAL | Status: DC | PRN

## 2017-02-14 MED ORDER — PENICILLIN G POT IN DEXTROSE 60000 UNIT/ML IV SOLN
3.0000 10*6.[IU] | INTRAVENOUS | Status: DC
  Administered 2017-02-14 – 2017-02-15 (×6): 3 10*6.[IU] via INTRAVENOUS
  Filled 2017-02-14 (×9): qty 50

## 2017-02-14 NOTE — Anesthesia Pain Management Evaluation Note (Signed)
  CRNA Pain Management Visit Note  Patient: Stacy Stevenson, 27 y.o., female  "Hello I am a member of the anesthesia team at [facility]. We have an anesthesia team available at all times to provide care throughout the hospital, including epidural management and anesthesia for C-section. I don't know your plan for the delivery whether it a natural birth, water birth, IV sedation, nitrous supplementation, doula or epidural, but we want to meet your pain goals."   1.Was your pain managed to your expectations on prior hospitalizations?   No prior hospitalizations  2.What is your expectation for pain management during this hospitalization?     IV pain meds  3.How can we help you reach that goal? Support prn  Record the patient's initial score and the patient's pain goal.   Pain: 0  Pain Goal: 5 [facility] wants you to be able to say your pain was always managed very well.  [facility] 02/14/2017

## 2017-02-14 NOTE — Progress Notes (Signed)
Patient ID: Stacy Stevenson, female   DOB: 1990-06-23, 26 y.o.   MRN: [id number] Pt doing well with no complaints. Has been ambulating VSS Cat 1, 135 Ctxs q 68mins 3/70/-2  P: AROM with moderate amount of clear fluid noted      Augment with pitocin if indicated      Recheck in 3 hrs or prn      Anticipate svd

## 2017-02-14 NOTE — H&P (Signed)
Stacy Stevenson is a 27 y.o. female presenting for elective iol at term with favorable cervix. Pt is dated per 10week [place]. She declined essential panel and first trimester screen. Pt has ss trait but FOB tested neg. Baby had fetal arrythmia but nl ECHO. Prenatal care otherwise nl. GBS positive  OB History    Gravida Para Term Preterm AB Living   1             SAB TAB Ectopic Multiple Live Births                 Past Medical History:  Diagnosis Date  . Chronic constipation   . Goiter   . Hyperprolactinemia ([place])   . Irregular periods    Past Surgical History:  Procedure Laterality Date  . NO PAST SURGERIES     Family History: family history includes Cancer in her maternal grandmother and paternal grandmother; Diabetes in her maternal grandmother and paternal grandmother. Social History:  reports that  has never smoked. she has never used smokeless tobacco. She reports that she drinks alcohol. She reports that she does not use drugs.     Maternal Diabetes: No Genetic Screening: Declined Maternal Ultrasounds/Referrals: Normal Fetal Ultrasounds or other Referrals:  Fetal echo nl Maternal Substance Abuse:  No Significant Maternal Medications:  None Significant Maternal Lab Results:  Lab values include: Group B Strep positive Other Comments:  None  Review of Systems  Constitutional: Positive for malaise/fatigue. Negative for chills and fever.  Eyes: Negative for blurred vision.  Respiratory: Negative for shortness of breath.   Cardiovascular: Negative for chest pain.  Gastrointestinal: Negative for abdominal pain, heartburn, nausea and vomiting.  Genitourinary: Negative for dysuria.  Musculoskeletal: Positive for back pain.  Skin: Negative for itching and rash.  Neurological: Negative for dizziness.  Endo/Heme/Allergies: Does not bruise/bleed easily.  Psychiatric/Behavioral: Negative for depression, hallucinations, substance abuse and suicidal ideas. The patient is not  nervous/anxious.    Maternal Medical History:  Reason for admission: Nausea. Elective iol  Contractions: Frequency: rare.   Perceived severity is mild.    Fetal activity: Perceived fetal activity is normal.   Last perceived fetal movement was within the past hour.    Prenatal complications: no prenatal complications Prenatal Complications - Diabetes: none.    Dilation: 1.5 Effacement (%): 70 Station: -2 Exam by:: Dorinda Hill RN Blood pressure 134/79, pulse 88, temperature 98.6 F (37 C), temperature source Oral, resp. rate 16, height 5\' 4"  (1.626 m), weight 183 lb (83 kg), last menstrual period 05/17/2016. Maternal Exam:  Uterine Assessment: Contraction strength is mild.  Contraction frequency is rare.   Abdomen: Patient reports generalized tenderness.  Estimated fetal weight is AGA.   Fetal presentation: vertex  Introitus: Normal vulva. Vulva is negative for condylomata and lesion.  Normal vagina.  Vagina is negative for condylomata and discharge.  Pelvis: adequate for delivery.   Cervix: Cervix evaluated by digital exam.     Physical Exam  Constitutional: She appears well-developed and well-nourished.  Eyes: Pupils are equal, round, and reactive to light.  Neck: Normal range of motion.  Cardiovascular: Normal rate.  Respiratory: Effort normal.  GI: Soft. There is generalized tenderness.  Genitourinary: Vagina normal and uterus normal. Vulva exhibits no lesion. No vaginal discharge found.  Musculoskeletal: Normal range of motion.  Neurological: She is alert.  Skin: Skin is warm.  Psychiatric: She has a normal mood and affect. Her behavior is normal. Judgment and thought content normal.    Prenatal labs: ABO,  Rh: --/--/O POS (02/09 0800) Antibody: NEG (02/09 0800) Rubella: Immune (07/18 0000) RPR: Nonreactive (07/18 0000)  HBsAg: Negative (07/18 0000)  HIV: Non-reactive (07/18 0000)  GBS: Positive (01/07 0000)   Assessment/Plan: Prime at 48 2/7wks for  elective iol  Cytotec placed at 8am; recheck at 10am Pain control prn Anticipate svd   Stacy Stevenson Stacy Stevenson 02/14/2017, 9:56 AM

## 2017-02-14 NOTE — Anesthesia Preprocedure Evaluation (Signed)
Anesthesia Evaluation  Patient identified by MRN, date of birth, ID band Patient awake    Reviewed: Allergy & Precautions, Patient's Chart, lab work & pertinent test results  Airway Mallampati: II  TM Distance: >3 FB     Dental   Pulmonary neg pulmonary ROS,    Pulmonary exam normal        Cardiovascular negative cardio ROS Normal cardiovascular exam     Neuro/Psych negative neurological ROS     GI/Hepatic negative GI ROS, Neg liver ROS,   Endo/Other  negative endocrine ROS  Renal/GU negative Renal ROS     Musculoskeletal   Abdominal   Peds  Hematology  (+) anemia ,   Anesthesia Other Findings   Reproductive/Obstetrics (+) Pregnancy                             Lab Results  Component Value Date   WBC 10.0 02/14/2017   HGB 11.0 (L) 02/14/2017   HCT 33.1 (L) 02/14/2017   MCV 74.2 (L) 02/14/2017   PLT 261 02/14/2017   No results found for: CREATININE, BUN, NA, K, CL, CO2  Anesthesia Physical Anesthesia Plan  ASA: II  Anesthesia Plan: Epidural   Post-op Pain Management:    Induction:   PONV Risk Score and Plan: Treatment may vary due to age or medical condition  Airway Management Planned: Natural Airway  Additional Equipment:   Intra-op Plan:   Post-operative Plan:   Informed Consent: I have reviewed the patients History and Physical, chart, labs and discussed the procedure including the risks, benefits and alternatives for the proposed anesthesia with the patient or authorized representative who has indicated his/her understanding and acceptance.     Plan Discussed with:   Anesthesia Plan Comments:         Anesthesia Quick Evaluation

## 2017-02-14 NOTE — Progress Notes (Signed)
Patient ID: Stacy Stevenson, female   DOB: 1990-11-07, 27 y.o.   MRN: [id number] Pt recovered well from fetal deceleration due to pt hypotension earlier. Pitocin restarted and back to 43mus. Pt with no complaints VSS 125, cat 2 Ctxs q 39mins 5/80/-2  Plan: IUPC placed; progressing well          FSE placed earlier dislodged          Expectant mgmt

## 2017-02-14 NOTE — Progress Notes (Signed)
80 mcg of phenylephrine administered, pt asymptomatic

## 2017-02-14 NOTE — Anesthesia Procedure Notes (Signed)
Epidural Patient location during procedure: OB Start time: 02/14/2017 4:44 PM End time: 02/14/2017 4:50 PM  Staffing Anesthesiologist: Suzette Battiest, MD Performed: anesthesiologist   Preanesthetic Checklist Completed: patient identified, site marked, surgical consent, pre-op evaluation, timeout performed, IV checked, risks and benefits discussed and monitors and equipment checked  Epidural Patient position: sitting Prep: site prepped and draped and DuraPrep Patient monitoring: continuous pulse ox and blood pressure Approach: midline Location: L3-L4 Injection technique: LOR air  Needle:  Needle type: Tuohy  Needle gauge: 17 G Needle length: 9 cm and 9 Needle insertion depth: 6 cm Catheter type: closed end flexible Catheter size: 19 Gauge Catheter at skin depth: 11 cm Test dose: negative  Assessment Events: blood not aspirated, injection not painful, no injection resistance, negative IV test and no paresthesia

## 2017-02-15 ENCOUNTER — Encounter (HOSPITAL_COMMUNITY): Payer: Self-pay

## 2017-02-15 ENCOUNTER — Encounter (HOSPITAL_COMMUNITY)
Admission: RE | Disposition: A | Discharge status: Home / Self Care | Payer: Self-pay | Source: Ambulatory Visit | Attending: Obstetrics and Gynecology

## 2017-02-15 DIAGNOSIS — Z98891 History of uterine scar from previous surgery: Secondary | ICD-10-CM

## 2017-02-15 LAB — RPR: RPR Ser Ql: NONREACTIVE

## 2017-02-15 SURGERY — Surgical Case
Anesthesia: Epidural | Site: Abdomen | Wound class: Clean Contaminated

## 2017-02-15 MED ORDER — MORPHINE SULFATE (PF) 0.5 MG/ML IJ SOLN
INTRAMUSCULAR | Status: DC | PRN
  Administered 2017-02-15: 4 mg via EPIDURAL

## 2017-02-15 MED ORDER — NALBUPHINE HCL 10 MG/ML IJ SOLN: 5.0000 mg | INTRAMUSCULAR | Status: DC | PRN

## 2017-02-15 MED ORDER — OXYCODONE HCL 5 MG PO TABS: 5.0000 mg | ORAL_TABLET | ORAL | Status: DC | PRN

## 2017-02-15 MED ORDER — CEFAZOLIN SODIUM-DEXTROSE 2-3 GM-%(50ML) IV SOLR
INTRAVENOUS | Status: DC | PRN
  Administered 2017-02-15: 2 g via INTRAVENOUS

## 2017-02-15 MED ORDER — COCONUT OIL OIL: 1.0000 "application " | TOPICAL_OIL | Status: DC | PRN

## 2017-02-15 MED ORDER — LACTATED RINGERS IV SOLN
INTRAVENOUS | Status: DC | PRN
  Administered 2017-02-15 (×2): via INTRAVENOUS

## 2017-02-15 MED ORDER — IBUPROFEN 600 MG PO TABS
600.0000 mg | ORAL_TABLET | Freq: Four times a day (QID) | ORAL | Status: DC
  Administered 2017-02-15 – 2017-02-17 (×7): 600 mg via ORAL
  Filled 2017-02-15 (×7): qty 1

## 2017-02-15 MED ORDER — PROMETHAZINE HCL 25 MG/ML IJ SOLN: 6.2500 mg | INTRAMUSCULAR | Status: DC | PRN

## 2017-02-15 MED ORDER — SODIUM CHLORIDE 0.9 % IR SOLN
Status: DC | PRN
  Administered 2017-02-15: 1

## 2017-02-15 MED ORDER — LACTATED RINGERS IV SOLN
INTRAVENOUS | Status: DC | PRN
  Administered 2017-02-15: 12:00:00 via INTRAVENOUS

## 2017-02-15 MED ORDER — DIBUCAINE 1 % RE OINT: 1.0000 "application " | TOPICAL_OINTMENT | RECTAL | Status: DC | PRN

## 2017-02-15 MED ORDER — OXYTOCIN 10 UNIT/ML IJ SOLN
INTRAMUSCULAR | Status: AC
  Filled 2017-02-15: qty 4

## 2017-02-15 MED ORDER — LACTATED RINGERS IV SOLN: INTRAVENOUS | Status: DC

## 2017-02-15 MED ORDER — SIMETHICONE 80 MG PO CHEW: 80.0000 mg | CHEWABLE_TABLET | ORAL | Status: DC

## 2017-02-15 MED ORDER — OXYCODONE HCL 5 MG PO TABS: 5.0000 mg | ORAL_TABLET | Freq: Once | ORAL | Status: DC | PRN

## 2017-02-15 MED ORDER — KETOROLAC TROMETHAMINE 30 MG/ML IJ SOLN
30.0000 mg | Freq: Once | INTRAMUSCULAR | Status: DC | PRN
  Administered 2017-02-15: 30 mg via INTRAVENOUS

## 2017-02-15 MED ORDER — MENTHOL 3 MG MT LOZG: 1.0000 | LOZENGE | OROMUCOSAL | Status: DC | PRN

## 2017-02-15 MED ORDER — PRENATAL MULTIVITAMIN CH
1.0000 | ORAL_TABLET | Freq: Every day | ORAL | Status: DC
  Administered 2017-02-16 – 2017-02-17 (×2): 1 via ORAL
  Filled 2017-02-15 (×2): qty 1

## 2017-02-15 MED ORDER — DIPHENHYDRAMINE HCL 50 MG/ML IJ SOLN: 12.5000 mg | INTRAMUSCULAR | Status: DC | PRN

## 2017-02-15 MED ORDER — NALBUPHINE HCL 10 MG/ML IJ SOLN: 5.0000 mg | Freq: Once | INTRAMUSCULAR | Status: DC | PRN

## 2017-02-15 MED ORDER — MISOPROSTOL 200 MCG PO TABS
ORAL_TABLET | ORAL | Status: AC
  Filled 2017-02-15: qty 4

## 2017-02-15 MED ORDER — ONDANSETRON HCL 4 MG/2ML IJ SOLN
INTRAMUSCULAR | Status: DC | PRN
  Administered 2017-02-15: 4 mg via INTRAVENOUS

## 2017-02-15 MED ORDER — SOD CITRATE-CITRIC ACID 500-334 MG/5ML PO SOLN: 30.0000 mL | ORAL | Status: DC

## 2017-02-15 MED ORDER — ONDANSETRON HCL 4 MG/2ML IJ SOLN
INTRAMUSCULAR | Status: AC
  Filled 2017-02-15: qty 2

## 2017-02-15 MED ORDER — OXYCODONE HCL 5 MG/5ML PO SOLN: 5.0000 mg | Freq: Once | ORAL | Status: DC | PRN

## 2017-02-15 MED ORDER — ACETAMINOPHEN 325 MG PO TABS: 650.0000 mg | ORAL_TABLET | ORAL | Status: DC | PRN

## 2017-02-15 MED ORDER — SENNOSIDES-DOCUSATE SODIUM 8.6-50 MG PO TABS
2.0000 | ORAL_TABLET | ORAL | Status: DC
  Administered 2017-02-15 – 2017-02-17 (×2): 2 via ORAL
  Filled 2017-02-15 (×2): qty 2

## 2017-02-15 MED ORDER — OXYTOCIN 40 UNITS IN LACTATED RINGERS INFUSION - SIMPLE MED: 2.5000 [IU]/h | INTRAVENOUS | Status: DC

## 2017-02-15 MED ORDER — SIMETHICONE 80 MG PO CHEW: 80.0000 mg | CHEWABLE_TABLET | Freq: Three times a day (TID) | ORAL | Status: DC

## 2017-02-15 MED ORDER — DEXAMETHASONE SODIUM PHOSPHATE 10 MG/ML IJ SOLN
INTRAMUSCULAR | Status: AC
  Filled 2017-02-15: qty 1

## 2017-02-15 MED ORDER — SODIUM BICARBONATE 8.4 % IV SOLN
INTRAVENOUS | Status: DC | PRN
  Administered 2017-02-15: 5 mL via EPIDURAL
  Administered 2017-02-15: 2 mL via EPIDURAL
  Administered 2017-02-15: 5 mL via EPIDURAL

## 2017-02-15 MED ORDER — OXYTOCIN 10 UNIT/ML IJ SOLN
INTRAVENOUS | Status: DC | PRN
  Administered 2017-02-15: 40 [IU] via INTRAVENOUS
  Administered 2017-02-15: 12:00:00 via INTRAVENOUS

## 2017-02-15 MED ORDER — DEXTROSE 5 % IV SOLN: 2.0000 g | INTRAVENOUS | Status: DC

## 2017-02-15 MED ORDER — DIPHENHYDRAMINE HCL 25 MG PO CAPS: 25.0000 mg | ORAL_CAPSULE | ORAL | Status: DC | PRN

## 2017-02-15 MED ORDER — TETANUS-DIPHTH-ACELL PERTUSSIS 5-2.5-18.5 LF-MCG/0.5 IM SUSP: 0.5000 mL | Freq: Once | INTRAMUSCULAR | Status: DC

## 2017-02-15 MED ORDER — SIMETHICONE 80 MG PO CHEW
80.0000 mg | CHEWABLE_TABLET | ORAL | Status: DC | PRN
  Administered 2017-02-15: 80 mg via ORAL
  Filled 2017-02-15: qty 1

## 2017-02-15 MED ORDER — MISOPROSTOL 25 MCG QUARTER TABLET
ORAL_TABLET | ORAL | Status: DC | PRN
  Administered 2017-02-15: 800 ug via RECTAL

## 2017-02-15 MED ORDER — SCOPOLAMINE 1 MG/3DAYS TD PT72
MEDICATED_PATCH | TRANSDERMAL | Status: AC
  Filled 2017-02-15: qty 1

## 2017-02-15 MED ORDER — OXYCODONE HCL 5 MG PO TABS: 10.0000 mg | ORAL_TABLET | ORAL | Status: DC | PRN

## 2017-02-15 MED ORDER — SODIUM CHLORIDE 0.9% FLUSH: 3.0000 mL | INTRAVENOUS | Status: DC | PRN

## 2017-02-15 MED ORDER — DIPHENHYDRAMINE HCL 25 MG PO CAPS: 25.0000 mg | ORAL_CAPSULE | Freq: Four times a day (QID) | ORAL | Status: DC | PRN

## 2017-02-15 MED ORDER — HYDROMORPHONE HCL 1 MG/ML IJ SOLN: 0.2500 mg | INTRAMUSCULAR | Status: DC | PRN

## 2017-02-15 MED ORDER — ONDANSETRON HCL 4 MG/2ML IJ SOLN: 4.0000 mg | Freq: Three times a day (TID) | INTRAMUSCULAR | Status: DC | PRN

## 2017-02-15 MED ORDER — NALOXONE HCL 4 MG/10ML IJ SOLN
1.0000 ug/kg/h | INTRAVENOUS | Status: DC | PRN
  Filled 2017-02-15: qty 5

## 2017-02-15 MED ORDER — SCOPOLAMINE 1 MG/3DAYS TD PT72
MEDICATED_PATCH | TRANSDERMAL | Status: DC | PRN
  Administered 2017-02-15: 1 via TRANSDERMAL

## 2017-02-15 MED ORDER — MORPHINE SULFATE (PF) 0.5 MG/ML IJ SOLN
INTRAMUSCULAR | Status: AC
  Filled 2017-02-15: qty 10

## 2017-02-15 MED ORDER — KETOROLAC TROMETHAMINE 30 MG/ML IJ SOLN
INTRAMUSCULAR | Status: AC
  Filled 2017-02-15: qty 1

## 2017-02-15 MED ORDER — MEPERIDINE HCL 25 MG/ML IJ SOLN: 6.2500 mg | INTRAMUSCULAR | Status: DC | PRN

## 2017-02-15 MED ORDER — SCOPOLAMINE 1 MG/3DAYS TD PT72
1.0000 | MEDICATED_PATCH | Freq: Once | TRANSDERMAL | Status: DC
  Filled 2017-02-15: qty 1

## 2017-02-15 MED ORDER — ZOLPIDEM TARTRATE 5 MG PO TABS: 5.0000 mg | ORAL_TABLET | Freq: Every evening | ORAL | Status: DC | PRN

## 2017-02-15 MED ORDER — NALOXONE HCL 0.4 MG/ML IJ SOLN: 0.4000 mg | INTRAMUSCULAR | Status: DC | PRN

## 2017-02-15 MED ORDER — DEXAMETHASONE SODIUM PHOSPHATE 10 MG/ML IJ SOLN
INTRAMUSCULAR | Status: DC | PRN
  Administered 2017-02-15: 10 mg via INTRAVENOUS

## 2017-02-15 MED ORDER — WITCH HAZEL-GLYCERIN EX PADS
1.0000 | MEDICATED_PAD | CUTANEOUS | Status: DC | PRN
Start: 2017-02-15 — End: 2017-02-17

## 2017-02-15 SURGICAL SUPPLY — 37 items
APL SKNCLS STERI-STRIP NONHPOA (GAUZE/BANDAGES/DRESSINGS) ×1
BENZOIN TINCTURE PRP APPL 2/3 (GAUZE/BANDAGES/DRESSINGS) ×1 IMPLANT
CHLORAPREP W/TINT 26ML (MISCELLANEOUS) ×2 IMPLANT
CLAMP CORD UMBIL (MISCELLANEOUS) IMPLANT
CLOTH BEACON ORANGE TIMEOUT ST (SAFETY) ×2 IMPLANT
DRAPE C SECTION CLR SCREEN (DRAPES) ×2 IMPLANT
DRSG OPSITE POSTOP 4X10 (GAUZE/BANDAGES/DRESSINGS) ×2 IMPLANT
ELECT REM PT RETURN 9FT ADLT (ELECTROSURGICAL) ×2
ELECTRODE REM PT RTRN 9FT ADLT (ELECTROSURGICAL) ×1 IMPLANT
EXTRACTOR VACUUM KIWI (MISCELLANEOUS) IMPLANT
GLOVE BIO SURGEON STRL SZ 6.5 (GLOVE) ×2 IMPLANT
GLOVE BIOGEL PI IND STRL 7.0 (GLOVE) ×2 IMPLANT
GLOVE BIOGEL PI INDICATOR 7.0 (GLOVE) ×2
GOWN STRL REUS W/TWL LRG LVL3 (GOWN DISPOSABLE) ×4 IMPLANT
HEMOSTAT ARISTA ABSORB 3G PWDR (MISCELLANEOUS) ×1 IMPLANT
KIT ABG SYR 3ML LUER SLIP (SYRINGE) IMPLANT
NDL HYPO 25X5/8 SAFETYGLIDE (NEEDLE) IMPLANT
NEEDLE HYPO 25X5/8 SAFETYGLIDE (NEEDLE) IMPLANT
NS IRRIG 1000ML POUR BTL (IV SOLUTION) ×2 IMPLANT
PACK C SECTION WH (CUSTOM PROCEDURE TRAY) ×2 IMPLANT
PAD OB MATERNITY 4.3X12.25 (PERSONAL CARE ITEMS) ×2 IMPLANT
RETRACTOR WND ALEXIS 25 LRG (MISCELLANEOUS) ×1 IMPLANT
RTRCTR C-SECT PINK 25CM LRG (MISCELLANEOUS) IMPLANT
RTRCTR WOUND ALEXIS 25CM LRG (MISCELLANEOUS) ×2
STRIP CLOSURE SKIN 1/2X4 (GAUZE/BANDAGES/DRESSINGS) ×1 IMPLANT
SUT CHROMIC 1 CTX 36 (SUTURE) ×4 IMPLANT
SUT PLAIN 0 NONE (SUTURE) IMPLANT
SUT PLAIN 2 0 XLH (SUTURE) ×2 IMPLANT
SUT VIC AB 0 CT1 27 (SUTURE) ×4
SUT VIC AB 0 CT1 27XBRD ANBCTR (SUTURE) ×2 IMPLANT
SUT VIC AB 2-0 CT1 27 (SUTURE) ×2
SUT VIC AB 2-0 CT1 TAPERPNT 27 (SUTURE) ×1 IMPLANT
SUT VIC AB 3-0 CT1 27 (SUTURE)
SUT VIC AB 3-0 CT1 TAPERPNT 27 (SUTURE) IMPLANT
SUT VIC AB 4-0 KS 27 (SUTURE) ×2 IMPLANT
TOWEL OR 17X24 6PK STRL BLUE (TOWEL DISPOSABLE) ×2 IMPLANT
TRAY FOLEY BAG SILVER LF 14FR (SET/KITS/TRAYS/PACK) ×2 IMPLANT

## 2017-02-15 NOTE — Progress Notes (Signed)
Patient ID: Stacy Stevenson, female   DOB: 10-Jun-1990, 27 y.o.   MRN: [id number] Cervix checked 60mins ago and unchanged from 5am- still 9cm dil Pt with no complaints T 100 Cat 1, 120, early decels Ctxs q 38mins; pit now at 4mus  P: Recheck in an hour       Discussed implications of arrest of dilation with pt and family. Still has inadequate mvus but due to cervical change made overnight pit was not increased; increase per protocol now

## 2017-02-15 NOTE — Progress Notes (Signed)
Patient ID: Stacy Stevenson, female   DOB: May 13, 1990, 27 y.o.   MRN: [id number] Pt doing well. No complaints VSS CAt 1, 120s, early decels noted Ctxs q 66mins 7/90/-1  A/P: Progressing well despite inadequate mvus - will increase pit per protocol          Continue to plan for svd

## 2017-02-15 NOTE — Progress Notes (Signed)
Patient ID: Stacy Stevenson, female   DOB: 10-Aug-1990, 27 y.o.   MRN: [id number] Ant lip still present on recheck Attempted to reduce with pushing unsuccessfully Pt expressed fatigue after 4 pushes. Fetal HR deceleration noted with pushing  Advised to proceed with primary cesarean section and reviewed risks and benefits and alternatives Pt consented Anesthesia and staff notified Stop pitocin ; strict NPO To OR when ready

## 2017-02-15 NOTE — Progress Notes (Signed)
Patient ID: Stacy Stevenson, female   DOB: 23-Jul-1990, 27 y.o.   MRN: [id number] Cervix 9.5cm but still -1 station Due to cervical change noted will recheck in an hour to see if progress noted  Pit now at 82mus Cat 1

## 2017-02-15 NOTE — Op Note (Signed)
Operative Note    Preoperative Diagnosis: IUP at term                                             Arrest of dilation   Postoperative Diagnosis: Same                                    Adherent placenta; mild uterine inversion   Procedure: Primary low transverse cesarean section with double layered closure   Surgeon: Terri Piedra, C DO  Anesthesia: Epidural  Fluids: LR 1800cc EBL: 593cc UOP: 200cc   Findings: Viable female infant in cephalic position with body cord; apgars 9,9; weight pending. Lower uterine segment inversion - easily replaced manually with no other interventions; adherent placenta. Grossly nl uterus ( 2cm ant fibroid), tubes and ovaries   Specimen: Placenta to pathology   Procedure Note Patient was taken to the operating room where epidural anesthesia was found to be adequate by Allis clamp test. She was prepped and draped in the normal sterile fashion in the dorsal supine position with a leftward tilt. An appropriate time out was performed. A Pfannenstiel skin incision was then made with the scalpel and carried through to the underlying layer of fascia by sharp dissection and Bovie cautery. The fascia was nicked in the midline and the incision was extended laterally with Mayo scissors. The inferior aspect of the incision was grasped Coker clamps and dissected off the underlying rectus muscles. In a similar fashion the superior aspect was dissected off the rectus muscles. Rectus muscles were separated in the midline and the peritoneal cavity entered bluntly. The peritoneal incision was then extended both superiorly and inferiorly with careful attention to avoid both bowel and bladder. The Alexis self-retaining wound retractor was then placed within the incision and the lower uterine segment exposed. The bladder flap was developed with Metzenbaum scissors and pushed away from the lower uterine segment. The lower uterine segment was then incised in a transverse fashion and the  cavity itself entered bluntly. The incision was extended bluntly. The infant's head was then lifted and delivered from the incision without difficulty. Bulb suction performed. The remainder of the infant delivered and the nose and mouth bulb suctioned again with the cord clamped and cut after a minute delay.  The infant was handed off to the waiting pediatricians. During delivery of the placenta, the lower uterine segment was noted to invert due to moderately adherent placenta. Trailing membranes in fundus also noted. The lower uterine segment was manually replaced after uterus exteriorized and all membranes removed. Arista applied with great hemostasis noted. Although the fundus was firm lower segment was boggy so 875mcg rectal cytotec placed at end of case.  The uterine incision was then repaired in 2 layers the first layer was a running locked layer 1-0 chromic and the second an imbricating layer of the same suture. Three figure 8 stitches were placed to stop bleeding due to boggy lower uterine segment in mid incision. The tubes and ovaries were inspected and the gutters cleared of all clots and debris.  The uterine incision was inspected and found to be hemostatic. All instruments and sponges as well as the Alexis retractor were then removed from the abdomen. The rectus muscles and peritoneum were then reapproximated in a running fashion.The fascia was  then closed with 0 Vicryl in a running fashion. The skin was closed with a subcuticular stitch of 4-0 Vicryl on a Keith needle and then reinforced with benzoin and Steri-Strips. At the conclusion of the procedure all instruments and sponge counts were correct. Patient was taken to the recovery room in good condition with her baby accompanying her skin to skin.

## 2017-02-15 NOTE — Transfer of Care (Signed)
Immediate Anesthesia Transfer of Care Note  Patient: Stacy Stevenson  Procedure(s) Performed: CESAREAN SECTION (N/A Abdomen)  Patient Location: PACU  Anesthesia Type:Epidural  Level of Consciousness: awake, alert  and oriented  Airway & Oxygen Therapy: Patient Spontanous Breathing  Post-op Assessment: Report given to RN and Post -op Vital signs reviewed and stable  Post vital signs: Reviewed and stable  Last Vitals:  Vitals:   02/15/17 1031 02/15/17 1131  BP: 123/85 (!) 146/55  Pulse: 95 (!) 115  Resp: 16   Temp:    SpO2:      Last Pain:  Vitals:   02/15/17 1031  TempSrc:   PainSc: 0-No pain      Patients Stated Pain Goal: 2 (35/36/14 4315)  Complications: No apparent anesthesia complications

## 2017-02-15 NOTE — Progress Notes (Signed)
Patient progressing well post-op. Orthostatics and initial ambulation with nurse done well. Patient state she had already stood up on her own prior without RN assistance and that she was fine. We discussed safety & fall prevention plan. Will continue to monitor. Jackson Latino, RN

## 2017-02-15 NOTE — Anesthesia Postprocedure Evaluation (Signed)
Anesthesia Post Note  Patient: Stacy Stevenson  Procedure(s) Performed: CESAREAN SECTION (N/A Abdomen)     Patient location during evaluation: PACU Anesthesia Type: Epidural Level of consciousness: awake and alert Pain management: pain level controlled Vital Signs Assessment: post-procedure vital signs reviewed and stable Respiratory status: spontaneous breathing, nonlabored ventilation and respiratory function stable Cardiovascular status: stable Postop Assessment: no headache, no backache and epidural receding Anesthetic complications: no    Last Vitals:  Vitals:   02/15/17 1830 02/15/17 2125  BP: 136/84   Pulse: 80   Resp: 18   Temp: 36.8 C   SpO2: 100% 100%    Last Pain:  Vitals:   02/15/17 2027  TempSrc:   PainSc: 1    Pain Goal: Patients Stated Pain Goal: 2 (02/14/17 1336)               Ryan P Ellender

## 2017-02-16 ENCOUNTER — Encounter (HOSPITAL_COMMUNITY): Payer: Self-pay | Admitting: Obstetrics and Gynecology

## 2017-02-16 LAB — CBC
HCT: 25.3 % — ABNORMAL LOW (ref 36.0–46.0)
Hemoglobin: 8.4 g/dL — ABNORMAL LOW (ref 12.0–15.0)
MCH: 24.3 pg — AB (ref 26.0–34.0)
MCHC: 33.2 g/dL (ref 30.0–36.0)
MCV: 73.1 fL — AB (ref 78.0–100.0)
PLATELETS: 246 10*3/uL (ref 150–400)
RBC: 3.46 MIL/uL — AB (ref 3.87–5.11)
RDW: 16.6 % — ABNORMAL HIGH (ref 11.5–15.5)
WBC: 25.1 10*3/uL — AB (ref 4.0–10.5)

## 2017-02-16 NOTE — Addendum Note (Signed)
Addendum  created 02/16/17 2493 by Hewitt Blade, CRNA   Charge Capture section accepted, Sign clinical note

## 2017-02-16 NOTE — Lactation Note (Signed)
This note was copied from a baby's chart. Lactation Consultation Note Baby 18 hrs. Mom stated baby wanted to BF a lot during the night. Mom has short shaft nipples, breast heavy. Hand expressed colostrum flows. Noted relieve of breast w/hand expression and baby BF. Nipple more compressible. Encourage mom to stimulate, compress nipples and areola to soften to make more compressible to obtain deeper latch.  Assess breast for transfer. Shells and hand pump given. Baby rooting, gets on the breast, not very aggressive. Discussed newborn behavior, feeding habits, STS, I&O, supply and demand.  Mom encouraged to feed baby 8-12 times/24 hours and with feeding cues. Mom encouraged to waken baby for feeds. Encouraged to call for assistance.  [place] brochure given w/resources, support groups and [place] services.  Patient Name: Boy Enrika Aguado BFXOV'A Date: 02/16/2017 Reason for consult: Initial assessment   Maternal Data Has patient been taught Hand Expression?: Yes Does the patient have breastfeeding experience prior to this delivery?: No  Feeding Feeding Type: Breast Fed Length of feed: 17 min(still BF)  LATCH Score Latch: Repeated attempts needed to sustain latch, nipple held in mouth throughout feeding, stimulation needed to elicit sucking reflex.  Audible Swallowing: A few with stimulation  Type of Nipple: Everted at rest and after stimulation(short shaft)  Comfort (Breast/Nipple): Soft / non-tender  Hold (Positioning): Assistance needed to correctly position infant at breast and maintain latch.  LATCH Score: 7  Interventions Interventions: Breast feeding basics reviewed;Assisted with latch;Breast compression;Shells;Skin to skin;Adjust position;Breast massage;Support pillows;Hand pump;Hand express;Position options;Expressed milk;Pre-pump if needed  Lactation Tools Discussed/Used Tools: Shells;Pump Shell Type: Inverted Breast pump type: Manual WIC Program: No Pump Review: Setup,  frequency, and cleaning;Milk Storage Initiated by:: Allayne Stack RN IBCLC Date initiated:: 02/16/17   Consult Status Consult Status: Follow-up Date: 02/16/17 Follow-up type: In-patient    Theodoro Kalata 02/16/2017, 6:39 AM

## 2017-02-16 NOTE — Progress Notes (Signed)
Subjective: Postpartum Day 1: Cesarean Delivery Patient reports incisional pain and tolerating PO.  Nl lochia, pain controlled  Objective: Vital signs in last 24 hours: Temp:  [98.2 F (36.8 C)-100.5 F (38.1 C)] 98.5 F (36.9 C) (02/11 0533) Pulse Rate:  [80-115] 83 (02/11 0533) Resp:  [16-22] 16 (02/11 0533) BP: (108-147)/(55-113) 108/68 (02/11 0533) SpO2:  [93 %-100 %] 99 % (02/11 0533)  Physical Exam:  General: alert and no distress Lochia: appropriate Uterine Fundus: firm Incision: healing well DVT Evaluation: No evidence of DVT seen on physical exam.  Recent Labs    02/14/17 0800 02/16/17 0502  HGB 11.0* 8.4*  HCT 33.1* 25.3*    Assessment/Plan: Status post Cesarean section. Doing well postoperatively.  Continue current care.  Stacy Stevenson 02/16/2017, 8:42 AM

## 2017-02-16 NOTE — Anesthesia Postprocedure Evaluation (Signed)
Anesthesia Post Note  Patient: Stacy Stevenson  Procedure(s) Performed: CESAREAN SECTION (N/A Abdomen)     Patient location during evaluation: Mother Baby Anesthesia Type: Epidural Level of consciousness: awake and alert Pain management: pain level controlled Vital Signs Assessment: post-procedure vital signs reviewed and stable Respiratory status: spontaneous breathing, nonlabored ventilation and respiratory function stable Cardiovascular status: stable Postop Assessment: no headache, no backache, epidural receding, patient able to bend at knees, adequate PO intake and no apparent nausea or vomiting Anesthetic complications: no    Last Vitals:  Vitals:   02/16/17 0305 02/16/17 0533  BP: 120/84 108/68  Pulse: 92 83  Resp: 18 16  Temp: 37.1 C 36.9 C  SpO2: 98% 99%    Last Pain:  Vitals:   02/16/17 0710  TempSrc:   PainSc: 0-No pain   Pain Goal: Patients Stated Pain Goal: 2 (02/14/17 1336)               Corbin Hott Hristova

## 2017-02-16 NOTE — Progress Notes (Signed)
CSW acknowledges consult, however a 9 on the EDPS does not warrant a consult for CSW.  MOB should have a score greater than 9/yes to question 10 on Edinburgh Postpartum Depression Screen for CSW consult.    Please contact the Clinical Social Worker if needs arise or by MOB's request.  Laurey Arrow, MSW, LCSW Clinical Social Work [phone]

## 2017-02-17 ENCOUNTER — Encounter (HOSPITAL_COMMUNITY): Payer: Self-pay | Admitting: *Deleted

## 2017-02-17 MED ORDER — IBUPROFEN 600 MG PO TABS: 600.0000 mg | ORAL_TABLET | Freq: Four times a day (QID) | ORAL | 0 refills | Status: DC

## 2017-02-17 MED ORDER — ACETAMINOPHEN 325 MG PO TABS: 650.0000 mg | ORAL_TABLET | ORAL | 1 refills | Status: DC | PRN

## 2017-02-17 NOTE — Progress Notes (Signed)
Subjective: Postpartum Day 2: Cesarean Delivery Patient reports incisional pain, tolerating PO and no problems voiding.  Pian controlled with ibuprofen and ambulating fine.  Objective: Vital signs in last 24 hours: Temp:  [98 F (36.7 C)-98.4 F (36.9 C)] 98 F (36.7 C) (02/12 0650) Pulse Rate:  [79-92] 85 (02/12 0650) Resp:  [17-18] 18 (02/12 0650) BP: (108-113)/(51-61) 113/55 (02/12 0650) SpO2:  [98 %] 98 % (02/11 0930)  Physical Exam:  General: alert and cooperative Lochia: appropriate Uterine Fundus: firm Incision: C/D/I   Recent Labs    02/16/17 0502  HGB 8.4*  HCT 25.3*    Assessment/Plan: Status post Cesarean section. Doing well postoperatively. d/c home later this PM Relative anemia, but tolerating fine.  Will start po iron  Discharge home with standard precautions and return to office in 2 weeks.  Logan Bores 02/17/2017, 8:53 AM

## 2017-02-17 NOTE — Lactation Note (Signed)
Lactation Consultation Note  Patient Name: Stacy Stevenson [id number] Date: 02/17/2017  Mom states baby has been cluster feeding.  Reassured.  Instructed to continue to feed with cues.  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  Lactation outpatient services encouraged prn.   Maternal Data    Feeding    LATCH Score                   Interventions    Lactation Tools Discussed/Used     Consult Status      Ave Filter 02/17/2017, 12:12 PM

## 2017-02-17 NOTE — Discharge Summary (Signed)
OB Discharge Summary     Patient Name: Stacy Stevenson DOB: July 27, 1990 MRN: [id number]  Date of admission: 02/14/2017 Delivering MD: Carlynn Purl [facility]   Date of discharge: 02/17/2017  Admitting diagnosis: 40 wk induction Intrauterine pregnancy: [redacted]w[redacted]d     Secondary diagnosis:  Active Problems:   Postpartum care following cesarean delivery   Arrest of dilation, delivered, current hospitalization   Status post primary low transverse cesarean section  Additional problems: arrest of dilation     Discharge diagnosis: Term Pregnancy Delivered                                                                                                Post partum procedures:none  Augmentation: AROM, Pitocin and Cytotec  Complications: Uterine Inversion--mild at time of c-section, easily reduced  Hospital course:  Induction of Labor With Cesarean Section  27 y.o. yo G1P0 at [redacted]w[redacted]d was admitted to the hospital 02/14/2017 for induction of labor. Patient had a labor course significant for arrest of dilation at 9cm.  . The patient went for cesarean section due to Arrest of Dilation, and delivered a Viable infant,02/15/2017  Membrane Rupture Time/Date: 11:45 AM ,02/14/2017   Details of operation can be found in separate operative Note.  Patient had an uncomplicated postpartum course. She is ambulating, tolerating a regular diet, passing flatus, and urinating well.  Patient is discharged home in stable condition on 02/17/17.                                    Physical exam  Vitals:   02/16/17 0800 02/16/17 0930 02/16/17 1853 02/17/17 0650  BP:  (!) 108/51 108/61 (!) 113/55  Pulse:  92 79 85  Resp:  18 17 18   Temp:  98.3 F (36.8 C) 98.4 F (36.9 C) 98 F (36.7 C)  TempSrc:  Oral Oral Oral  SpO2: 99% 98%    Weight:      Height:       General: alert and cooperative Lochia: appropriate Uterine Fundus: firm Incision: Dressing is clean, dry, and intact  Labs: Lab Results  Component Value Date   WBC  25.1 (H) 02/16/2017   HGB 8.4 (L) 02/16/2017   HCT 25.3 (L) 02/16/2017   MCV 73.1 (L) 02/16/2017   PLT 246 02/16/2017   No flowsheet data found.  Discharge instruction: per After Visit Summary and "Baby and Me Booklet".  After visit meds:  Allergies as of 02/17/2017   No Known Allergies     Medication List    TAKE these medications   acetaminophen 325 MG tablet Commonly known as:  TYLENOL Take 2 tablets (650 mg total) by mouth every 4 (four) hours as needed (for pain scale < 4ORtemperature>/=100.5 F).   ibuprofen 600 MG tablet Commonly known as:  ADVIL,MOTRIN Take 1 tablet (600 mg total) by mouth every 6 (six) hours.   prenatal multivitamin Tabs tablet Take 1 tablet by mouth daily at 12 noon.       Diet: routine diet  Activity: Advance as  tolerated. Pelvic rest for 6 weeks.   Outpatient follow up:2 weeks Follow up Appt:No future appointments. Follow up Visit:No Follow-up on file.  Postpartum contraception: Undecided  Newborn Data: Live born female  Birth Weight: 7 lb 13.8 oz (3565 g) APGAR: 21, 9  Newborn Delivery   Birth date/time:  02/15/2017 12:10:00 Delivery type:  C-Section, Low Transverse C-section categorization:  Primary     Baby Feeding: Breast Disposition:home with mother   02/17/2017 Logan Bores, MD

## 2017-02-18 ENCOUNTER — Telehealth (HOSPITAL_COMMUNITY): Payer: Self-pay | Admitting: Lactation Services

## 2017-02-18 NOTE — Telephone Encounter (Signed)
Lactation telephone call:  Mom called concerned due to the baby not latching, able to hand express, and has a pump.  LC recommending burping the baby 1st due to [facility] hearing the baby cry in the back ground.  LC suggested to hand express, drip milk in the baby's mouth and settle down, latch.  If the baby won't latch, pump for 5-7 mins and feed back to baby.  Call back if the plan isn't working.

## 2017-02-24 DIAGNOSIS — Z4801 Encounter for change or removal of surgical wound dressing: Secondary | ICD-10-CM | POA: Diagnosis not present

## 2017-03-02 DIAGNOSIS — T8189XD Other complications of procedures, not elsewhere classified, subsequent encounter: Secondary | ICD-10-CM | POA: Diagnosis not present

## 2017-03-04 ENCOUNTER — Telehealth (HOSPITAL_COMMUNITY): Payer: Self-pay | Admitting: Lactation Services

## 2017-03-04 ENCOUNTER — Encounter (HOSPITAL_COMMUNITY): Payer: Self-pay

## 2017-03-04 NOTE — Telephone Encounter (Signed)
Returns mom call for BF questions. Mom reports that infant fed last night and has been spitting with some feedings with burps. He has not been spitty today. Enc frequent burping and keeping upright for 20-30 minutes post feeding.  Mom reports infant inhales food when bottle feeding. She is stopping him and allowing him to catch his breast and burping him. Referred there to Paced bottle feeding on [email or web address]. Discussed if spitting continues and/or infant continues spitting, can try Dr. Saul Fordyce bottle or other nipples that may have a slower flow.   Mom reports some pain with initial latch and is pinching some. Once infant gets fully latched the pain goes away. Enc mom to wait for wide open mouth prior to latching. Nipple care discussed.   Mom reports that sometimes when she is feeding infant and thinks he is finished, she will take him off the burp him and he will root again. Enc mom to follow feeding cues and allow infant to relatch if he is still showing feeding cues.   Mom reports all questions/concerns have been answered. Mom to call back with further questions/concerns as needed.

## 2017-03-18 IMAGING — US US THYROID
1 series · 13 of 25 positions shown · non-contrast
Comparison: 11/21/2014

CLINICAL DATA: Prior ultrasound follow-up.  Enlargement of thyroid.

EXAM:
THYROID ULTRASOUND
TECHNIQUE: Ultrasound examination of the thyroid gland and adjacent soft
tissues was performed.

[Series 1: us thyroid · 0.06mm/px · 13 of 50 slices shown]
[im 1/50]
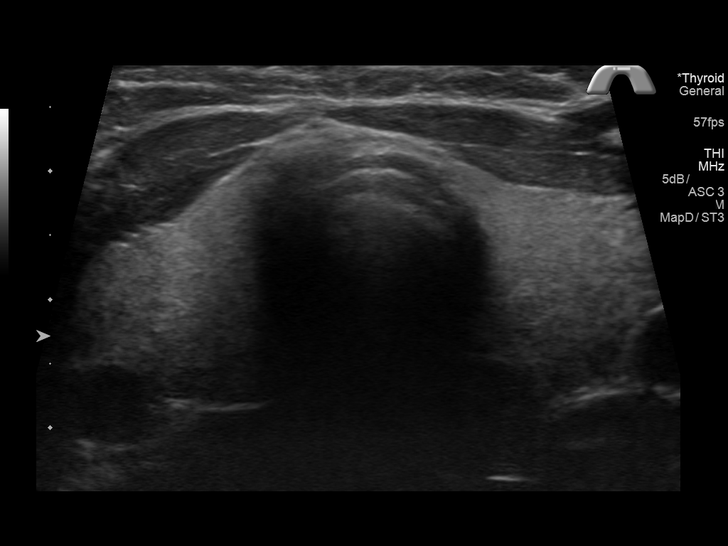
[im 5/50]
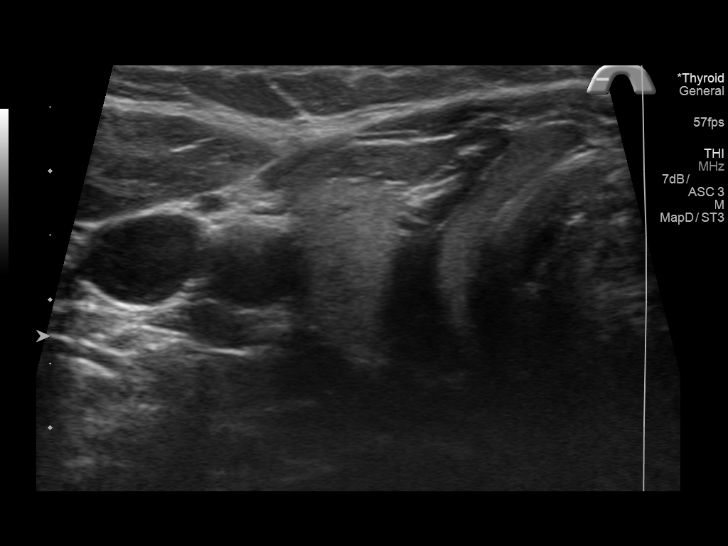
[im 9/50]
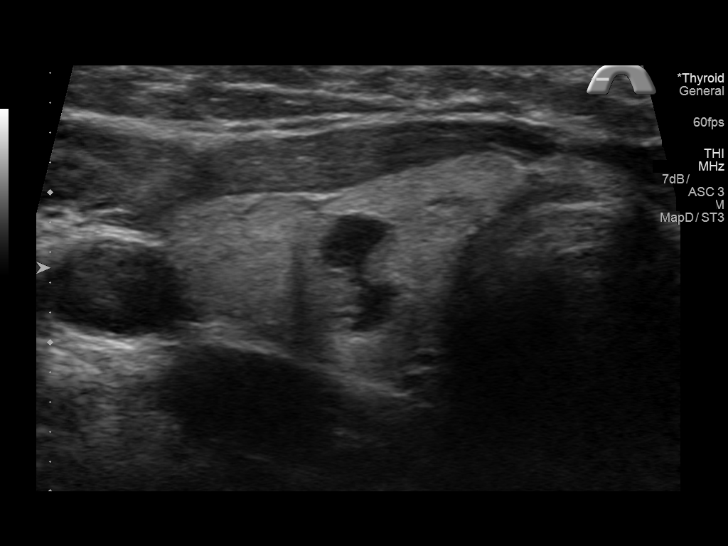
[im 13/50]
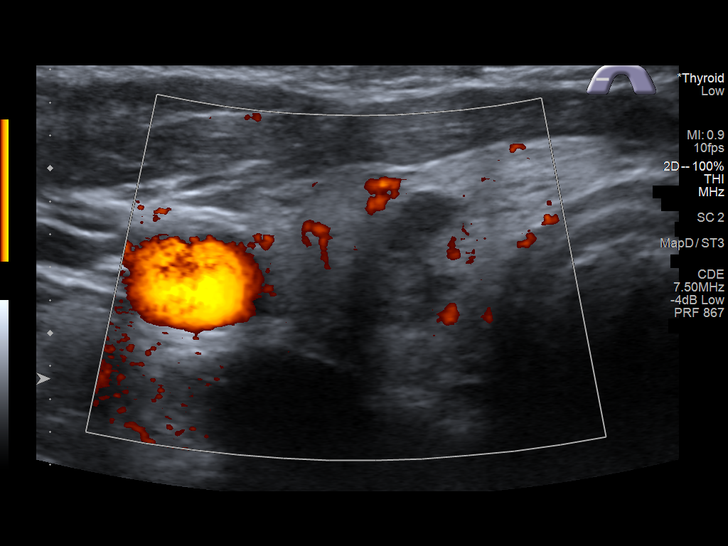
[im 17/50]
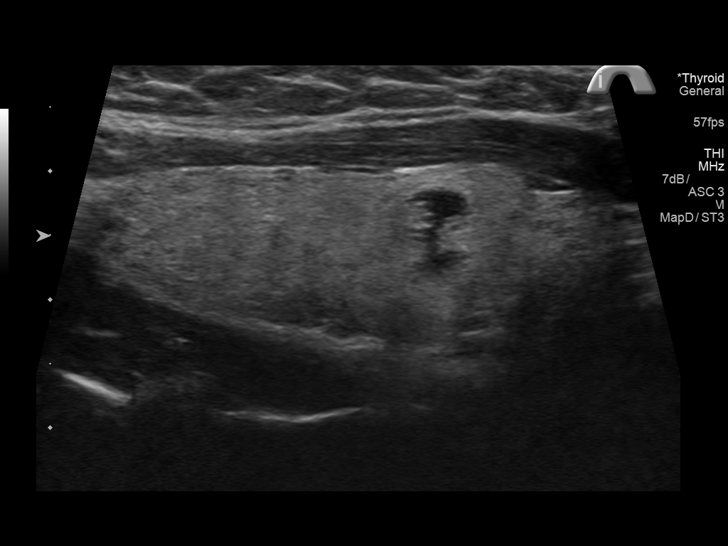
[im 21/50]
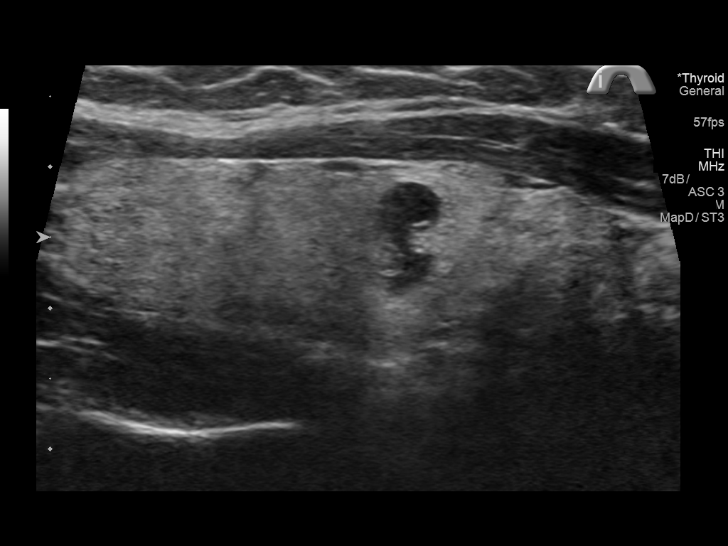
[im 25/50]
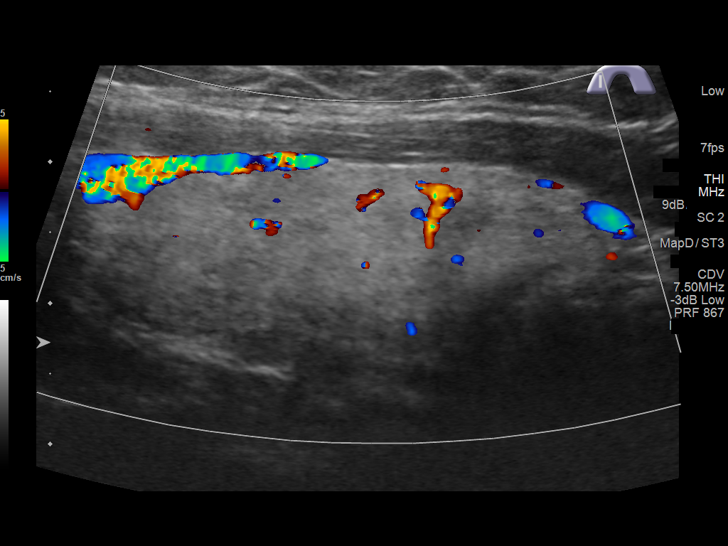
[im 29/50]
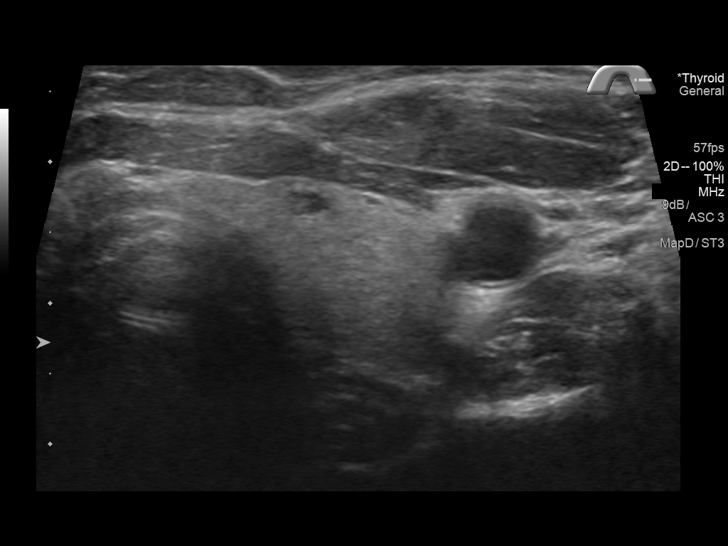
[im 33/50]
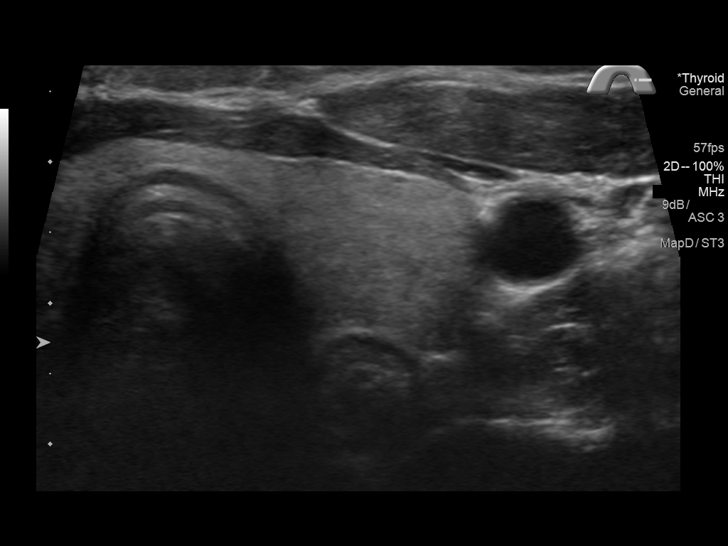
[im 37/50]
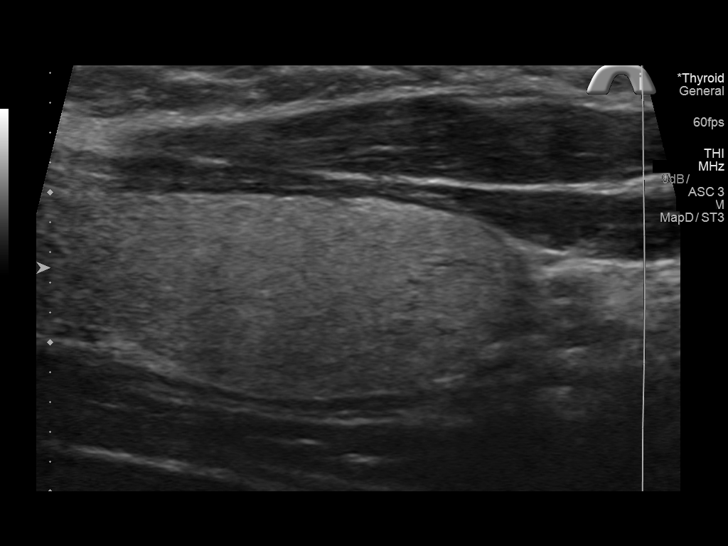
[im 41/50]
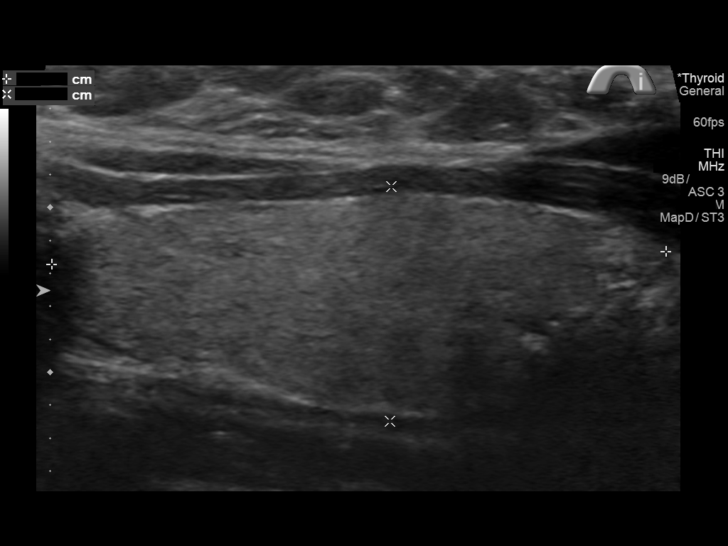
[im 45/50]
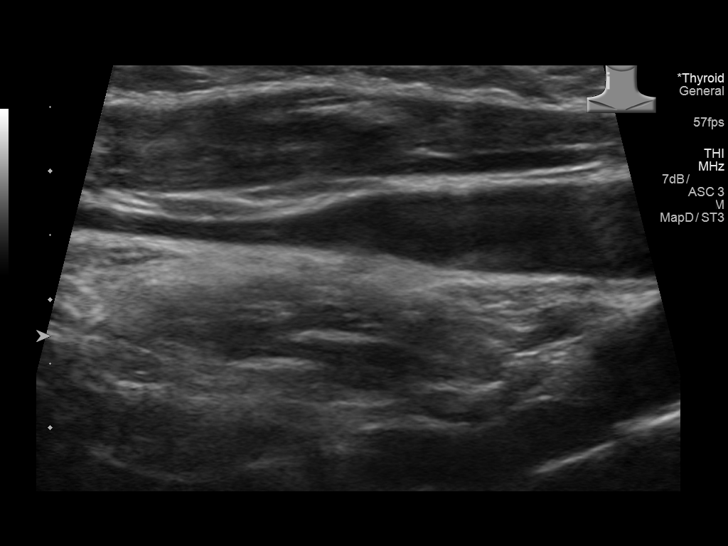
[im 50/50]
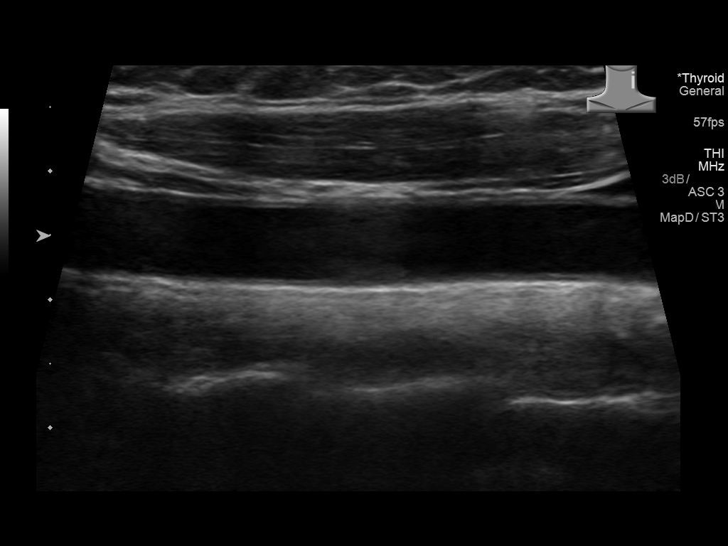

[13 of 25 positions shown; findings below may reference images not displayed]

FINDINGS: Parenchymal Echotexture: Normal

Estimated total number of nodules >/= 1 cm: 0

Number of spongiform nodules >/=  2 cm not described below (TR1): 0

Number of mixed cystic and solid nodules >/= 1.5 cm not described
below (TR2): 0

_________________________________________________________

Isthmus: Measures 0.3 cm in thickness.

No discrete nodules are identified within the thyroid isthmus.

_________________________________________________________

Right lobe: Measures 4.3 x 1.5 x 1.6 cm.

Nodule # 1:

Location: Right; Mid

Size: 0.8 x 0.9 x 0.7 cm and previously measured 0.7 x 0.6 x 0.6 cm.

Composition: mixed cystic and solid (1)

Echogenicity: isoechoic (1)

Shape: taller-than-wide (3)

Margins: ill-defined (0)

Echogenic foci: none (0)

ACR TI-RADS total points: 5.

ACR TI-RADS risk category: TR4 (4-6 points).

ACR TI-RADS recommendations:

Given size (<0.9 cm) and appearance, this nodule does NOT meet
TI-RADS criteria for biopsy or dedicated follow-up.

_________________________________________________________

Left lobe: Measures 3.7 x 1.4 x 1.4 cm.

Small heterogeneous hypoechoic nodule in the superior left thyroid
lobe measuring up to 0.3 cm.
IMPRESSION: Small bilateral thyroid nodules. The mixed solid and cystic nodule
in the right thyroid lobe has minimally changed. These nodules do
not meet criteria for biopsy or dedicated follow-up.

The above is in keeping with the ACR TI-RADS recommendations - [HOSPITAL] 8038;[DATE].

## 2017-04-06 DIAGNOSIS — Z1389 Encounter for screening for other disorder: Secondary | ICD-10-CM | POA: Diagnosis not present

## 2017-04-06 DIAGNOSIS — Z3009 Encounter for other general counseling and advice on contraception: Secondary | ICD-10-CM | POA: Diagnosis not present

## 2017-06-10 ENCOUNTER — Telehealth (HOSPITAL_COMMUNITY): Payer: Self-pay | Admitting: Lactation Services

## 2017-06-10 NOTE — Telephone Encounter (Signed)
Mother phoned in with concerns about decreased milk supply. Infant is 55 months old. 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 She reports that she noticed that she had a tender area under her axillary area. She confirms that there is no noted redness or warm to touch. She reports that she also pumped and only got 2 ounces with her pumping while at work. 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 She wants her to wait until 12 noon. Mother is concerned that this will decrease her supply even more.  Mother reports that infant is having a growth  Spurt.   Mother was advised to look in mirror and view breast well for any reddened areas or areas that are warm to touch. Advised mother to do good breast massage on a regular basis . Mother requesting any information to increase supply. Discussed power pumping, use of a supplement, examples given. Mother advised to add an additional pumping while at work if possible. Advised mother to feed infant frequently . Advised to post pump at the time of day that she is most full. Most likely in am. When at home.  Suggested that mother talk with her manager about her need to pump more frequently while at work. Suggested that mother look into using the [employer] pump while at work.  Mother very receptive to all teaching. She was advised to follow up by phone for any other concerns.

## 2017-07-02 DIAGNOSIS — Z0000 Encounter for general adult medical examination without abnormal findings: Secondary | ICD-10-CM | POA: Diagnosis not present

## 2017-07-02 DIAGNOSIS — Z13220 Encounter for screening for lipoid disorders: Secondary | ICD-10-CM | POA: Diagnosis not present

## 2017-07-13 DIAGNOSIS — R350 Frequency of micturition: Secondary | ICD-10-CM | POA: Diagnosis not present

## 2017-07-13 DIAGNOSIS — R109 Unspecified abdominal pain: Secondary | ICD-10-CM | POA: Diagnosis not present

## 2017-09-11 DIAGNOSIS — Z113 Encounter for screening for infections with a predominantly sexual mode of transmission: Secondary | ICD-10-CM | POA: Diagnosis not present

## 2017-09-11 DIAGNOSIS — Z130 Encounter for screening for diseases of the blood and blood-forming organs and certain disorders involving the immune mechanism: Secondary | ICD-10-CM | POA: Diagnosis not present

## 2017-09-11 DIAGNOSIS — Z6825 Body mass index (BMI) 25.0-25.9, adult: Secondary | ICD-10-CM | POA: Diagnosis not present

## 2017-09-11 DIAGNOSIS — Z01419 Encounter for gynecological examination (general) (routine) without abnormal findings: Secondary | ICD-10-CM | POA: Diagnosis not present

## 2017-09-11 DIAGNOSIS — Z1389 Encounter for screening for other disorder: Secondary | ICD-10-CM | POA: Diagnosis not present

## 2017-09-11 DIAGNOSIS — R61 Generalized hyperhidrosis: Secondary | ICD-10-CM | POA: Diagnosis not present

## 2017-09-11 DIAGNOSIS — Z3041 Encounter for surveillance of contraceptive pills: Secondary | ICD-10-CM | POA: Diagnosis not present

## 2017-11-19 DIAGNOSIS — H52223 Regular astigmatism, bilateral: Secondary | ICD-10-CM | POA: Diagnosis not present

## 2017-11-19 DIAGNOSIS — H5213 Myopia, bilateral: Secondary | ICD-10-CM | POA: Diagnosis not present

## 2017-11-19 DIAGNOSIS — E049 Nontoxic goiter, unspecified: Secondary | ICD-10-CM | POA: Diagnosis not present

## 2017-11-19 DIAGNOSIS — E221 Hyperprolactinemia: Secondary | ICD-10-CM | POA: Diagnosis not present

## 2017-12-22 DIAGNOSIS — R309 Painful micturition, unspecified: Secondary | ICD-10-CM | POA: Diagnosis not present

## 2017-12-22 DIAGNOSIS — R102 Pelvic and perineal pain: Secondary | ICD-10-CM | POA: Diagnosis not present

## 2017-12-22 DIAGNOSIS — N3941 Urge incontinence: Secondary | ICD-10-CM | POA: Diagnosis not present

## 2018-01-12 DIAGNOSIS — N393 Stress incontinence (female) (male): Secondary | ICD-10-CM | POA: Diagnosis not present

## 2018-01-12 DIAGNOSIS — N8311 Corpus luteum cyst of right ovary: Secondary | ICD-10-CM | POA: Diagnosis not present

## 2018-01-12 DIAGNOSIS — R102 Pelvic and perineal pain: Secondary | ICD-10-CM | POA: Diagnosis not present

## 2018-01-21 DIAGNOSIS — M6208 Separation of muscle (nontraumatic), other site: Secondary | ICD-10-CM | POA: Diagnosis not present

## 2018-01-21 DIAGNOSIS — N393 Stress incontinence (female) (male): Secondary | ICD-10-CM | POA: Diagnosis not present

## 2018-01-21 DIAGNOSIS — M62838 Other muscle spasm: Secondary | ICD-10-CM | POA: Diagnosis not present

## 2018-01-21 DIAGNOSIS — R3982 Chronic bladder pain: Secondary | ICD-10-CM | POA: Diagnosis not present

## 2018-01-21 DIAGNOSIS — M6281 Muscle weakness (generalized): Secondary | ICD-10-CM | POA: Diagnosis not present

## 2018-02-04 DIAGNOSIS — N8302 Follicular cyst of left ovary: Secondary | ICD-10-CM | POA: Diagnosis not present

## 2018-02-04 DIAGNOSIS — R1030 Lower abdominal pain, unspecified: Secondary | ICD-10-CM | POA: Diagnosis not present

## 2018-02-04 DIAGNOSIS — M6289 Other specified disorders of muscle: Secondary | ICD-10-CM | POA: Diagnosis not present

## 2018-02-04 DIAGNOSIS — M6281 Muscle weakness (generalized): Secondary | ICD-10-CM | POA: Diagnosis not present

## 2018-02-04 DIAGNOSIS — M62838 Other muscle spasm: Secondary | ICD-10-CM | POA: Diagnosis not present

## 2018-02-04 DIAGNOSIS — M6208 Separation of muscle (nontraumatic), other site: Secondary | ICD-10-CM | POA: Diagnosis not present

## 2018-02-04 DIAGNOSIS — R102 Pelvic and perineal pain: Secondary | ICD-10-CM | POA: Diagnosis not present

## 2018-02-04 DIAGNOSIS — N3941 Urge incontinence: Secondary | ICD-10-CM | POA: Diagnosis not present

## 2018-02-25 DIAGNOSIS — R1030 Lower abdominal pain, unspecified: Secondary | ICD-10-CM | POA: Diagnosis not present

## 2018-02-25 DIAGNOSIS — M62838 Other muscle spasm: Secondary | ICD-10-CM | POA: Diagnosis not present

## 2018-02-25 DIAGNOSIS — M6289 Other specified disorders of muscle: Secondary | ICD-10-CM | POA: Diagnosis not present

## 2018-02-25 DIAGNOSIS — R102 Pelvic and perineal pain: Secondary | ICD-10-CM | POA: Diagnosis not present

## 2018-02-25 DIAGNOSIS — N393 Stress incontinence (female) (male): Secondary | ICD-10-CM | POA: Diagnosis not present

## 2018-02-25 DIAGNOSIS — M6281 Muscle weakness (generalized): Secondary | ICD-10-CM | POA: Diagnosis not present

## 2018-03-11 DIAGNOSIS — R1030 Lower abdominal pain, unspecified: Secondary | ICD-10-CM | POA: Diagnosis not present

## 2018-03-11 DIAGNOSIS — R102 Pelvic and perineal pain: Secondary | ICD-10-CM | POA: Diagnosis not present

## 2018-03-11 DIAGNOSIS — M62838 Other muscle spasm: Secondary | ICD-10-CM | POA: Diagnosis not present

## 2018-03-11 DIAGNOSIS — R3982 Chronic bladder pain: Secondary | ICD-10-CM | POA: Diagnosis not present

## 2018-03-11 DIAGNOSIS — M6281 Muscle weakness (generalized): Secondary | ICD-10-CM | POA: Diagnosis not present

## 2018-03-11 DIAGNOSIS — M6289 Other specified disorders of muscle: Secondary | ICD-10-CM | POA: Diagnosis not present

## 2018-03-18 DIAGNOSIS — N83201 Unspecified ovarian cyst, right side: Secondary | ICD-10-CM | POA: Diagnosis not present

## 2018-09-07 DIAGNOSIS — E041 Nontoxic single thyroid nodule: Secondary | ICD-10-CM | POA: Diagnosis not present

## 2018-09-07 DIAGNOSIS — Z6829 Body mass index (BMI) 29.0-29.9, adult: Secondary | ICD-10-CM | POA: Diagnosis not present

## 2018-09-07 DIAGNOSIS — E663 Overweight: Secondary | ICD-10-CM | POA: Diagnosis not present

## 2018-09-07 DIAGNOSIS — G5601 Carpal tunnel syndrome, right upper limb: Secondary | ICD-10-CM | POA: Diagnosis not present

## 2018-09-15 IMAGING — DX DG CHEST 2V
2 series · 2 of 2 positions shown · non-contrast
Comparison: None in PACs

CLINICAL DATA: Chronic nonproductive cough for the past 4 months
without other chest complaints. Nonsmoker.

EXAM:
CHEST  2 VIEW

[dg chest 2 view (1 of 2)]
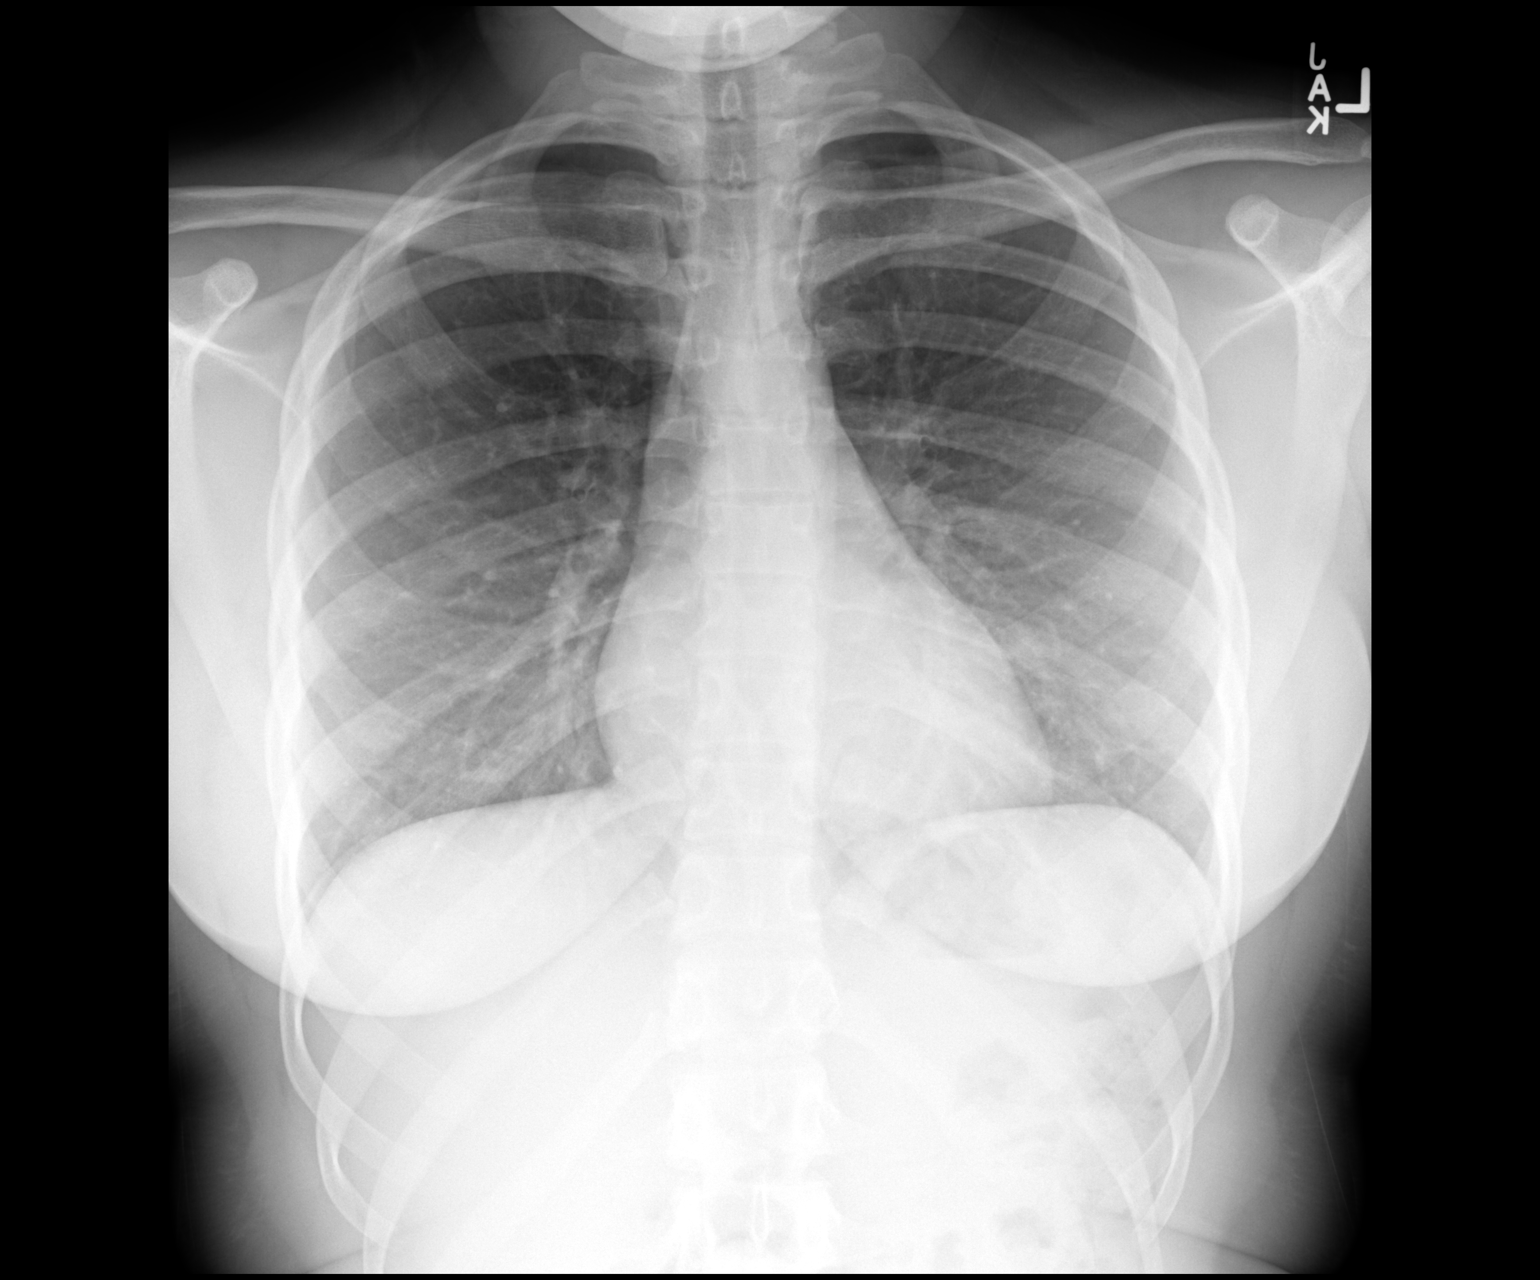

[dg chest 2 view (2 of 2)]
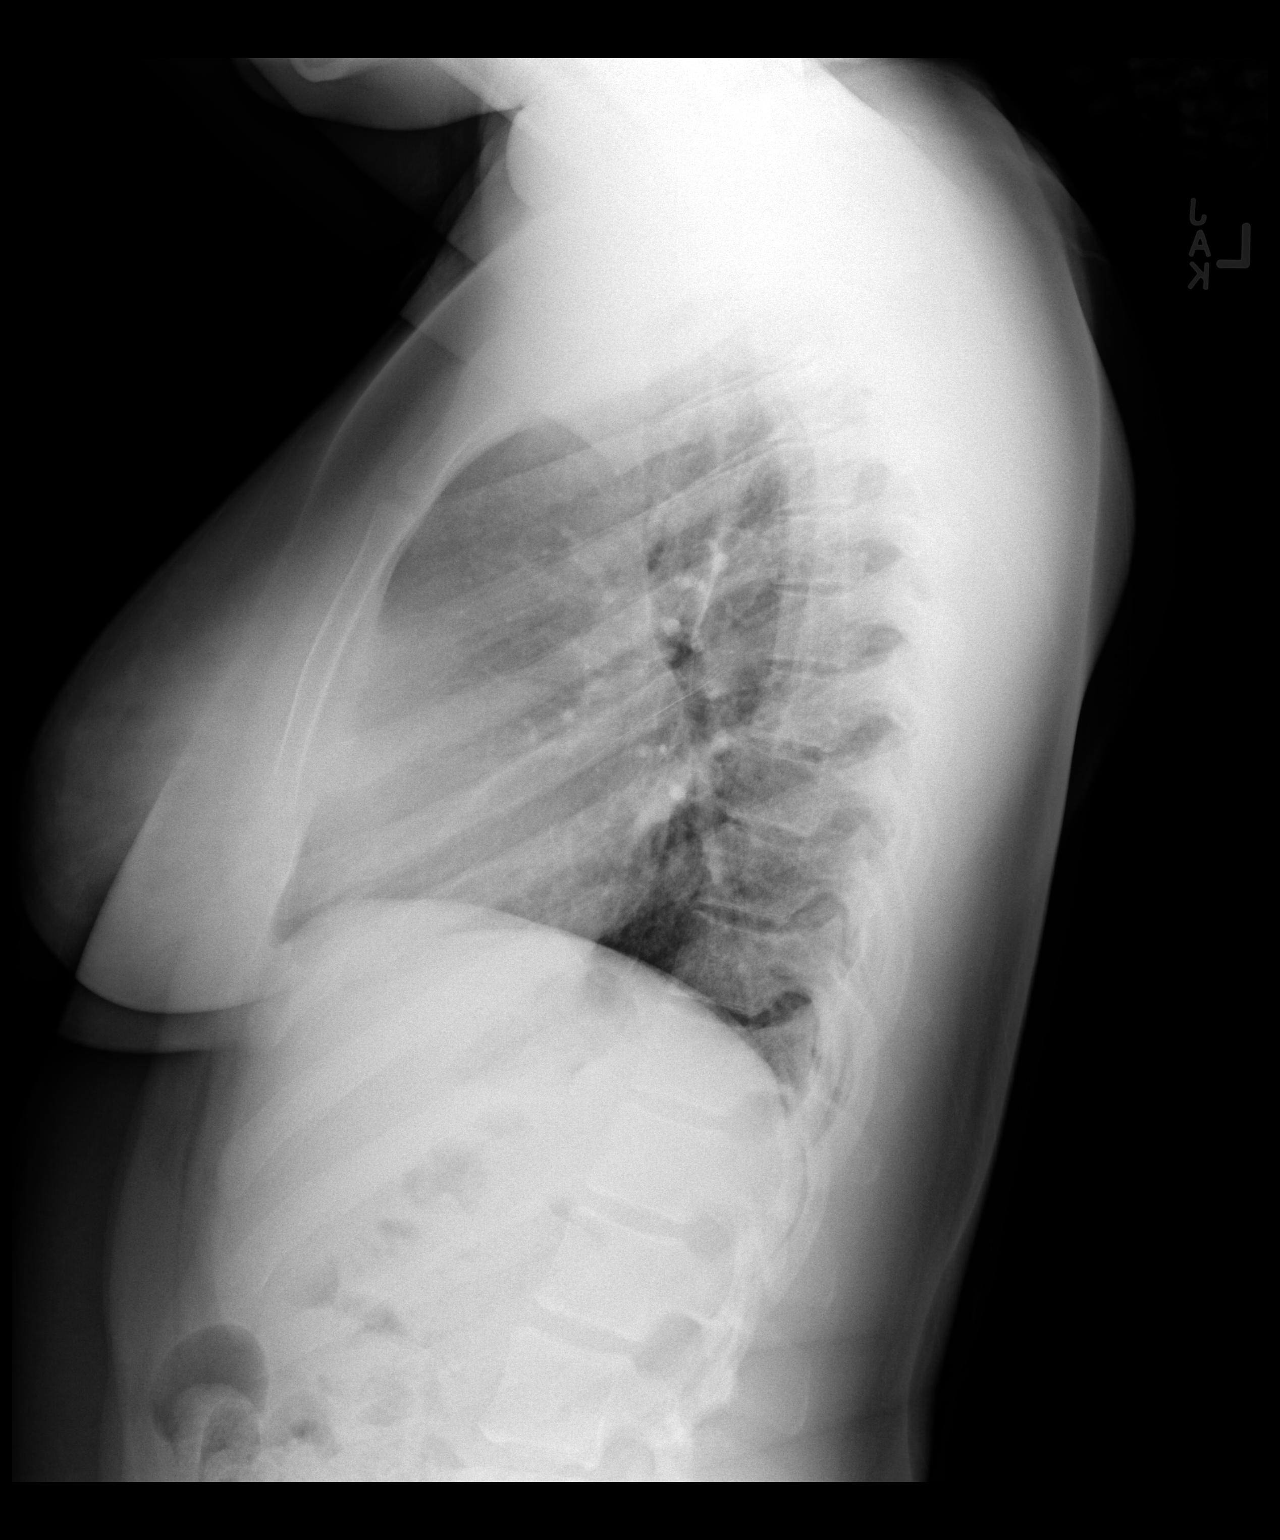

[2 of 2 positions shown; findings below may reference images not displayed]

FINDINGS: The lungs are adequately inflated. The lung markings are coarse in
the mid and lower lung zones. There is no alveolar infiltrate or
pleural effusion. No pulmonary parenchymal nodules or masses are
observed. The heart and pulmonary vascularity are normal. The
mediastinum is normal in width. The trachea is midline. The bony
thorax exhibits no acute abnormality.
IMPRESSION: Coarse lung markings in the mid and lower chest compatible with
bronchitic change. No pneumonia nor CHF.

## 2018-10-14 DIAGNOSIS — J069 Acute upper respiratory infection, unspecified: Secondary | ICD-10-CM | POA: Diagnosis not present

## 2018-10-14 DIAGNOSIS — J029 Acute pharyngitis, unspecified: Secondary | ICD-10-CM | POA: Diagnosis not present

## 2018-10-29 DIAGNOSIS — E041 Nontoxic single thyroid nodule: Secondary | ICD-10-CM | POA: Diagnosis not present

## 2018-10-29 DIAGNOSIS — Z0000 Encounter for general adult medical examination without abnormal findings: Secondary | ICD-10-CM | POA: Diagnosis not present

## 2018-10-29 DIAGNOSIS — Z13220 Encounter for screening for lipoid disorders: Secondary | ICD-10-CM | POA: Diagnosis not present

## 2018-12-15 DIAGNOSIS — Z03818 Encounter for observation for suspected exposure to other biological agents ruled out: Secondary | ICD-10-CM | POA: Diagnosis not present

## 2018-12-15 DIAGNOSIS — Z20828 Contact with and (suspected) exposure to other viral communicable diseases: Secondary | ICD-10-CM | POA: Diagnosis not present

## 2018-12-28 DIAGNOSIS — Z0000 Encounter for general adult medical examination without abnormal findings: Secondary | ICD-10-CM | POA: Diagnosis not present

## 2018-12-28 DIAGNOSIS — Z13220 Encounter for screening for lipoid disorders: Secondary | ICD-10-CM | POA: Diagnosis not present

## 2018-12-28 DIAGNOSIS — E041 Nontoxic single thyroid nodule: Secondary | ICD-10-CM | POA: Diagnosis not present

## 2019-01-18 DIAGNOSIS — N644 Mastodynia: Secondary | ICD-10-CM | POA: Diagnosis not present

## 2019-02-07 ENCOUNTER — Other Ambulatory Visit: Payer: Self-pay | Admitting: Physician Assistant

## 2019-02-07 DIAGNOSIS — N6452 Nipple discharge: Secondary | ICD-10-CM

## 2019-02-07 DIAGNOSIS — N644 Mastodynia: Secondary | ICD-10-CM

## 2019-02-09 DIAGNOSIS — Z6828 Body mass index (BMI) 28.0-28.9, adult: Secondary | ICD-10-CM | POA: Diagnosis not present

## 2019-02-09 DIAGNOSIS — Z1151 Encounter for screening for human papillomavirus (HPV): Secondary | ICD-10-CM | POA: Diagnosis not present

## 2019-02-09 DIAGNOSIS — Z1389 Encounter for screening for other disorder: Secondary | ICD-10-CM | POA: Diagnosis not present

## 2019-02-09 DIAGNOSIS — R947 Abnormal results of other endocrine function studies: Secondary | ICD-10-CM | POA: Diagnosis not present

## 2019-02-09 DIAGNOSIS — Z01419 Encounter for gynecological examination (general) (routine) without abnormal findings: Secondary | ICD-10-CM | POA: Diagnosis not present

## 2019-02-09 DIAGNOSIS — Z124 Encounter for screening for malignant neoplasm of cervix: Secondary | ICD-10-CM | POA: Diagnosis not present

## 2019-02-16 ENCOUNTER — Other Ambulatory Visit: Payer: 59

## 2019-03-04 ENCOUNTER — Inpatient Hospital Stay: Admission: RE | Admit: 2019-03-04 | Payer: 59 | Source: Ambulatory Visit

## 2019-03-21 ENCOUNTER — Ambulatory Visit
Admission: RE | Admit: 2019-03-21 | Discharge: 2019-03-21 | Disposition: A | Discharge status: Home / Self Care | Payer: 59 | Source: Ambulatory Visit | Attending: Physician Assistant | Admitting: Physician Assistant

## 2019-03-21 ENCOUNTER — Other Ambulatory Visit: Payer: Self-pay

## 2019-03-21 DIAGNOSIS — N6452 Nipple discharge: Secondary | ICD-10-CM | POA: Diagnosis not present

## 2019-03-21 DIAGNOSIS — N644 Mastodynia: Secondary | ICD-10-CM

## 2019-06-07 DIAGNOSIS — L293 Anogenital pruritus, unspecified: Secondary | ICD-10-CM | POA: Diagnosis not present

## 2019-06-07 DIAGNOSIS — B373 Candidiasis of vulva and vagina: Secondary | ICD-10-CM | POA: Diagnosis not present

## 2019-06-07 DIAGNOSIS — Z113 Encounter for screening for infections with a predominantly sexual mode of transmission: Secondary | ICD-10-CM | POA: Diagnosis not present

## 2019-06-07 DIAGNOSIS — N760 Acute vaginitis: Secondary | ICD-10-CM | POA: Diagnosis not present

## 2019-07-01 DIAGNOSIS — N898 Other specified noninflammatory disorders of vagina: Secondary | ICD-10-CM | POA: Diagnosis not present

## 2019-07-14 DIAGNOSIS — K011 Impacted teeth: Secondary | ICD-10-CM | POA: Diagnosis not present

## 2019-07-14 DIAGNOSIS — M2631 Crowding of fully erupted teeth: Secondary | ICD-10-CM | POA: Diagnosis not present

## 2019-08-01 DIAGNOSIS — N941 Unspecified dyspareunia: Secondary | ICD-10-CM | POA: Diagnosis not present

## 2019-08-01 DIAGNOSIS — R109 Unspecified abdominal pain: Secondary | ICD-10-CM | POA: Diagnosis not present

## 2019-08-10 DIAGNOSIS — N941 Unspecified dyspareunia: Secondary | ICD-10-CM | POA: Diagnosis not present

## 2019-08-17 DIAGNOSIS — D259 Leiomyoma of uterus, unspecified: Secondary | ICD-10-CM | POA: Diagnosis not present

## 2019-08-17 DIAGNOSIS — R102 Pelvic and perineal pain: Secondary | ICD-10-CM | POA: Diagnosis not present

## 2019-08-17 DIAGNOSIS — N8302 Follicular cyst of left ovary: Secondary | ICD-10-CM | POA: Diagnosis not present

## 2019-08-17 DIAGNOSIS — N9412 Deep dyspareunia: Secondary | ICD-10-CM | POA: Diagnosis not present

## 2019-11-17 DIAGNOSIS — N930 Postcoital and contact bleeding: Secondary | ICD-10-CM | POA: Diagnosis not present

## 2019-11-17 DIAGNOSIS — D573 Sickle-cell trait: Secondary | ICD-10-CM | POA: Diagnosis not present

## 2019-11-17 DIAGNOSIS — D259 Leiomyoma of uterus, unspecified: Secondary | ICD-10-CM | POA: Diagnosis not present

## 2019-11-17 DIAGNOSIS — N898 Other specified noninflammatory disorders of vagina: Secondary | ICD-10-CM | POA: Diagnosis not present

## 2019-11-18 DIAGNOSIS — Z113 Encounter for screening for infections with a predominantly sexual mode of transmission: Secondary | ICD-10-CM | POA: Diagnosis not present

## 2019-11-23 ENCOUNTER — Ambulatory Visit: Payer: 59

## 2019-12-28 ENCOUNTER — Ambulatory Visit: Payer: 59 | Attending: Obstetrics and Gynecology | Admitting: Genetic Counselor

## 2019-12-28 ENCOUNTER — Other Ambulatory Visit: Payer: Self-pay

## 2019-12-28 ENCOUNTER — Ambulatory Visit: Payer: Self-pay | Admitting: Genetic Counselor

## 2019-12-28 DIAGNOSIS — D573 Sickle-cell trait: Secondary | ICD-10-CM

## 2019-12-28 DIAGNOSIS — Z315 Encounter for genetic counseling: Secondary | ICD-10-CM

## 2019-12-28 DIAGNOSIS — Z3169 Encounter for other general counseling and advice on procreation: Secondary | ICD-10-CM | POA: Diagnosis not present

## 2019-12-28 NOTE — Progress Notes (Signed)
12/28/2019  Stacy Stevenson 12-06-1990 MRN: [id number] DOV: 12/28/2019  Patient location: home Provider location: [facility] office Persons involved in the visit: patient, patient's boyfriend, provider  I connected with Ms. Behrens on 12/28/2019 at 10:30 AM EST by WebEx and verified that I am speaking with the correct person using two identifiers. Ms. Brasington was referred to the [facility] for a genetics consultation regarding her risk of having a child with sickle cell disease. Ms. Weiler presented to her appointment with her partner, Berna Spare.  Indication for genetic counseling - Sickle cell trait and partner with sickle beta thalassemia  Prenatal history  Ms. Sakata is a G1P1001, 29 y.o. female. This was a preconception consultation; thus, prenatal history was not reviewed in detail. Ms. Coppola has a two year old son from a prior relationship and has not had any pregnancies with her current partner.   Family History  A three generation pedigree was drafted and reviewed. The family history is remarkable for the following:  - Ms. Gaetano, her twin brother, and her father reportedly all have sickle cell trait. See Discussion for more details.  - Mr. Lasure's boyfriend Berna Spare has sickle beta thalassemia by report. His full brother also reportedly has sickle beta thalassemia. Berna Spare reportedly has two paternal half brothers with sickle cell trait, one of whom has two children with sickle cell disease. See Discussion section for more details.   - One of Mr. Swopes paternal half brothers has a son with learning delays. The rest of this child's history is noncontributory, and no one else in the family has learning delays.   The remaining family histories were reviewed and found to be noncontributory for birth defects, intellectual disability, recurrent pregnancy loss, and known genetic conditions.    The patient's ancestry is African American.  Her partner's ancestry is Tree surgeon. Consanguinity was denied. The couple were uncertain if they have any Ashkenazi Jewish ancestry. Pedigree will be scanned under Media.  Discussion  Sickle cell disease & sickle beta thalassemia:  Ms. Mcgilvray was referred for genetic counseling as she is reportedly a carrier for sickle cell disease and her partner Berna Spare is reportedly affected by sickle beta thalassemia. The couple did not have access to their testing reports that confirmed these diagnoses. Per Berna Spare, he has a history of splenic sequestration, pain crises, and acute chest syndrome. He has been seen at [facility] and was enrolled in a treatment research study there in the past.  We discussed that sickle cell disease (SCD) is one condition in a group of blood disorders that affect hemoglobin in red blood cells (hemoglobinopathies). Hemoglobin is a protein that transports oxygen from the lungs to organs and tissues throughout the body. Individuals with SCD have an inherited structural abnormality in hemoglobin's beta globin chains due to a single amino acid change in the HBB gene. Instead of producing normal adult hemoglobin (HbA), individuals with SCD produce an atypical form of hemoglobin called hemoglobin S (HbS). Typically, individuals are expected to have two copies of HbA (HbAA). Individuals who are carriers of SCD have one copy of HbA and one copy of HbS (HbAS), whereas individuals affected by SCD have two copies of HbS (HbSS). Carriers of SCD are often said to have sickle cell "trait".   HbS alters the configuration of the hemoglobin molecule. As a result, individuals with SCD have red blood cells that can sickle and obstruct blood flow in small blood vessels,  causing ischemia of tissues and organs and episodes of vaso-occlusive crisis. The amino acid change in the HBB gene also causes red blood cells to become fragile and break down easily, which results in chronic anemia.  Additional complications associated with SCD may include organ damage, frequent infections, acute chest syndrome, ischemic stroke, splenic sequestration, priapism, and pulmonary hypertension.    HbS is just one variant form of hemoglobin caused by a mutation in the HBB gene. Individuals with sickle beta thalassemia have one copy of HbS and another variant in the HBB gene that causes beta thalassemia. Depending on the beta thalassemia mutation an individual has, they may create no normal hemoglobin (sickle beta zero thalassemia) or may produce a reduced amount of normal hemoglobin (sickle beta plus thalassemia). The presence of sickle-shaped red blood cells combined with the reduction or absence of mature red blood cells leads to the features of sickle beta thalassemia, such as anemia and pain crises. These symptoms usually develop in early childhood and differ in severity depending on the normal amount of hemoglobin that is produced. Individuals with sickle beta thalassemia are also at increased risk for complications that can be seen with sickle cell disease, such as organ damage, frequent infections, acute chest syndrome, ischemic stroke, splenic sequestration, priapism, and pulmonary hypertension.   We discussed that there are several treatments available for SCD and associated hemoglobinopathies. These include prophylactic antibiotics to protect against infections, blood transfusions for anemia, and medications like Hydroxyurea and Endari that reduce complications of SCD. Additionally, bone marrow transplants are the only cure for SCD at this time. Researchers are currently investigating gene therapy for hemoglobinopathies as well.  We reviewed that both SCD and sickle beta thalassemia are inherited in an autosomal recessive pattern. Since Ms. Yiu has sickle cell trait and her partner has sickle beta thalassemia, the couple has a 25% chance of having a child with sickle cell trait, a 25% chance of having  a child with beta thalassemia trait, a 25% chance of having a child with SCD, and a 25% chance of having a child with sickle beta thalassemia. These recurrence risks apply to each of the couple's future pregnancies, regardless of fetal sex.  Pregnancy options:  The couple was counseled about their pregnancy options. We discussed that preimplantation genetic testing for monogenic disorders (PGT-M) including hemoglobinopathies is possible for future pregnancies. PGT-M utilizes in vitro fertilization, with genetic testing for a single gene disorder of interest performed prior to embryo implantation. Embryos that are unaffected are then be transferred to the uterus for implantation. We discussed that this technology is relatively new, and thus the process can be costly. Insurance oftentimes does not cover the entire cost of the procedure. We reviewed that there are several other pregnancy options as well, including embryo donation, adoption, and natural conception.   Testing options:  We also reviewed testing options that are available to assess for hemoglobinopathies during pregnancy. Firstly, we discussed a new type of noninvasive prenatal screening (NIPS) that assesses for both chromosomal aneuploidies and four autosomal recessive conditions (CF, spinal muscular atrophy, sickle cell disease, and thalassemias). This screening option is called Unity and is offered by the laboratory [facility]. NIPS analyzes cell free DNA originating from the placenta that is found in the maternal blood circulation during pregnancy. Traditionally, NIPS is used to assesses for the risk of chromosomal aneuploidies in a pregnancy. Unity combines aneuploidy NIPS, maternal carrier screening, and single gene NIPS to determine the risk for chromosomal aneuploidies and single gene disorders  including SCD in a fetus. We discussed that this technology is relatively new and thus comprehensive studies with large sample sizes have not yet  been performed. We also reviewed that this screening is not diagnostic but may provide information regarding the presence or absence of extra DNA for chromosomes 13, 18, 21, and sex chromosomes and adjust the risk for SCD in a pregnancy. I informed Ms. Lutterman that if she is interested in this option during a future pregnancy, I would contact the laboratory to verify that this would be a valid screening option for her.  Secondly, Ms. Shuck was counseled regarding the option of diagnostic testing via chorionic villus sampling (CVS) or amniocentesis. CVS is available between 11-14 weeks' gestation and amniocentesis is available any time after 16 weeks' gestation. We discussed the technical aspects of each procedure and quoted up to a 1 in 500 (0.2%) risk for spontaneous pregnancy loss or other adverse pregnancy outcomes as a result of either procedure. Cultured cells from either a placental or amniotic fluid sample allow for the visualization of a fetal karyotype, which can detect >99% of chromosomal aberrations. Chromosomal microarray can also be performed to identify smaller deletions or duplications of fetal chromosomal material. CVS or amniocentesis could also be performed to assess whether the baby is affected by SCD or sickle beta thalassemia.  Finally, the couple was informed that they have the option of waiting to pursue postnatal testing via newborn screening.  Plan:  I encouraged Ms. Diven to contact me when she gets pregnant in the future if she is interested in pursuing testing for SCD or sickle beta thalassemia. I also offered to write a patient letter summarizing the information reviewed in today's session, which Ms. Ripple expressed interest in. I will email this to her once it is complete.  I counseled Ms. Mcmullan regarding the above risks and available options. The approximate face-to-face time with the genetic counselor was 50 minutes.  In summary:  Discussed history of sickle cell trait and  risks for hemoglobinopathies in future children  Patient reportedly has sickle cell trait and patient's partner reportedly has sickle beta thalassemia (test results not available for my review)  25% chance of having child with sickle cell disease & 50% chance of having child with sickle beta thalassemia with each pregnancy  Discussed pregnancy options  IVF with preimplantation genetic testing of the HBB gene  Other options include embryo donation, adoption, and natural conception  Reviewed testing options available during pregnancy  Unity NIPS for chromosomal aneuploidies & hemoglobinopathies. Would need to confirm with lab that screening would be useful for couple prior to ordering  Diagnostic testing via CVS or amniocentesis  Postnatal testing via newborn screening  Reviewed family history concerns   Buelah Manis, MS, Counselling psychologist

## 2020-01-16 DIAGNOSIS — Z20828 Contact with and (suspected) exposure to other viral communicable diseases: Secondary | ICD-10-CM | POA: Diagnosis not present

## 2020-02-03 DIAGNOSIS — J069 Acute upper respiratory infection, unspecified: Secondary | ICD-10-CM | POA: Diagnosis not present

## 2020-03-22 DIAGNOSIS — Z6831 Body mass index (BMI) 31.0-31.9, adult: Secondary | ICD-10-CM | POA: Diagnosis not present

## 2020-03-22 DIAGNOSIS — Z202 Contact with and (suspected) exposure to infections with a predominantly sexual mode of transmission: Secondary | ICD-10-CM | POA: Diagnosis not present

## 2020-03-22 DIAGNOSIS — Z1389 Encounter for screening for other disorder: Secondary | ICD-10-CM | POA: Diagnosis not present

## 2020-03-22 DIAGNOSIS — E221 Hyperprolactinemia: Secondary | ICD-10-CM | POA: Diagnosis not present

## 2020-03-22 DIAGNOSIS — Z1329 Encounter for screening for other suspected endocrine disorder: Secondary | ICD-10-CM | POA: Diagnosis not present

## 2020-03-22 DIAGNOSIS — Z01419 Encounter for gynecological examination (general) (routine) without abnormal findings: Secondary | ICD-10-CM | POA: Diagnosis not present

## 2020-03-22 DIAGNOSIS — Z130 Encounter for screening for diseases of the blood and blood-forming organs and certain disorders involving the immune mechanism: Secondary | ICD-10-CM | POA: Diagnosis not present

## 2020-04-05 DIAGNOSIS — Z0000 Encounter for general adult medical examination without abnormal findings: Secondary | ICD-10-CM | POA: Diagnosis not present

## 2020-04-05 DIAGNOSIS — Z13220 Encounter for screening for lipoid disorders: Secondary | ICD-10-CM | POA: Diagnosis not present

## 2020-04-06 DIAGNOSIS — R718 Other abnormality of red blood cells: Secondary | ICD-10-CM | POA: Diagnosis not present

## 2020-05-07 DIAGNOSIS — Z20828 Contact with and (suspected) exposure to other viral communicable diseases: Secondary | ICD-10-CM | POA: Diagnosis not present

## 2020-07-30 DIAGNOSIS — N920 Excessive and frequent menstruation with regular cycle: Secondary | ICD-10-CM | POA: Diagnosis not present

## 2020-07-30 DIAGNOSIS — N939 Abnormal uterine and vaginal bleeding, unspecified: Secondary | ICD-10-CM | POA: Diagnosis not present

## 2020-07-30 DIAGNOSIS — Z309 Encounter for contraceptive management, unspecified: Secondary | ICD-10-CM | POA: Diagnosis not present

## 2020-08-29 DIAGNOSIS — D250 Submucous leiomyoma of uterus: Secondary | ICD-10-CM | POA: Diagnosis not present

## 2020-10-23 ENCOUNTER — Emergency Department (HOSPITAL_COMMUNITY)
Admission: EM | Admit: 2020-10-23 | Discharge: 2020-10-23 | Disposition: A | Discharge status: Home / Self Care | Payer: 59 | Attending: Emergency Medicine | Admitting: Emergency Medicine

## 2020-10-23 DIAGNOSIS — Z5321 Procedure and treatment not carried out due to patient leaving prior to being seen by health care provider: Secondary | ICD-10-CM | POA: Diagnosis not present

## 2020-10-23 DIAGNOSIS — M25562 Pain in left knee: Secondary | ICD-10-CM | POA: Insufficient documentation

## 2020-10-23 NOTE — ED Triage Notes (Signed)
Pt. Stated, My left leg started in pain 2 weeks ago and this weekend the left leg started to swell.. Pain is mostly around the knee area. No history of blood clot

## 2020-10-23 NOTE — ED Notes (Signed)
Patient decided to leave and go see a doctor across the street

## 2020-10-29 DIAGNOSIS — Z3041 Encounter for surveillance of contraceptive pills: Secondary | ICD-10-CM | POA: Diagnosis not present

## 2020-11-08 DIAGNOSIS — Z03818 Encounter for observation for suspected exposure to other biological agents ruled out: Secondary | ICD-10-CM | POA: Diagnosis not present

## 2020-11-08 DIAGNOSIS — J069 Acute upper respiratory infection, unspecified: Secondary | ICD-10-CM | POA: Diagnosis not present

## 2020-11-08 DIAGNOSIS — R059 Cough, unspecified: Secondary | ICD-10-CM | POA: Diagnosis not present

## 2020-11-08 DIAGNOSIS — J029 Acute pharyngitis, unspecified: Secondary | ICD-10-CM | POA: Diagnosis not present

## 2021-01-02 DIAGNOSIS — Z20822 Contact with and (suspected) exposure to covid-19: Secondary | ICD-10-CM | POA: Diagnosis not present

## 2021-01-03 DIAGNOSIS — Z20822 Contact with and (suspected) exposure to covid-19: Secondary | ICD-10-CM | POA: Diagnosis not present

## 2021-01-04 DIAGNOSIS — Z20822 Contact with and (suspected) exposure to covid-19: Secondary | ICD-10-CM | POA: Diagnosis not present

## 2021-01-07 DIAGNOSIS — Z20822 Contact with and (suspected) exposure to covid-19: Secondary | ICD-10-CM | POA: Diagnosis not present

## 2021-01-08 DIAGNOSIS — Z20822 Contact with and (suspected) exposure to covid-19: Secondary | ICD-10-CM | POA: Diagnosis not present

## 2021-01-09 DIAGNOSIS — Z20822 Contact with and (suspected) exposure to covid-19: Secondary | ICD-10-CM | POA: Diagnosis not present

## 2021-01-31 DIAGNOSIS — M654 Radial styloid tenosynovitis [de Quervain]: Secondary | ICD-10-CM | POA: Diagnosis not present

## 2021-02-11 DIAGNOSIS — M654 Radial styloid tenosynovitis [de Quervain]: Secondary | ICD-10-CM | POA: Diagnosis not present

## 2021-02-11 DIAGNOSIS — M79662 Pain in left lower leg: Secondary | ICD-10-CM | POA: Diagnosis not present

## 2021-02-11 DIAGNOSIS — M79661 Pain in right lower leg: Secondary | ICD-10-CM | POA: Diagnosis not present

## 2021-03-14 DIAGNOSIS — Z308 Encounter for other contraceptive management: Secondary | ICD-10-CM | POA: Diagnosis not present

## 2021-03-14 DIAGNOSIS — R61 Generalized hyperhidrosis: Secondary | ICD-10-CM | POA: Diagnosis not present

## 2021-03-18 DIAGNOSIS — Z13220 Encounter for screening for lipoid disorders: Secondary | ICD-10-CM | POA: Diagnosis not present

## 2021-03-18 DIAGNOSIS — Z0000 Encounter for general adult medical examination without abnormal findings: Secondary | ICD-10-CM | POA: Diagnosis not present

## 2021-03-18 DIAGNOSIS — M25561 Pain in right knee: Secondary | ICD-10-CM | POA: Diagnosis not present

## 2021-03-22 DIAGNOSIS — M25562 Pain in left knee: Secondary | ICD-10-CM | POA: Diagnosis not present

## 2021-03-22 DIAGNOSIS — M25561 Pain in right knee: Secondary | ICD-10-CM | POA: Diagnosis not present

## 2021-04-25 ENCOUNTER — Encounter: Payer: Self-pay | Admitting: Physical Therapy

## 2021-04-25 ENCOUNTER — Ambulatory Visit: Payer: 59 | Attending: Sports Medicine | Admitting: Physical Therapy

## 2021-04-25 DIAGNOSIS — R29898 Other symptoms and signs involving the musculoskeletal system: Secondary | ICD-10-CM | POA: Insufficient documentation

## 2021-04-25 DIAGNOSIS — M25561 Pain in right knee: Secondary | ICD-10-CM | POA: Insufficient documentation

## 2021-04-25 NOTE — Therapy (Addendum)
OUTPATIENT PHYSICAL THERAPY LOWER EXTREMITY EVALUATION   Patient Name: Stacy Stevenson MRN: [id number] DOB:[birth year], 31 y.o., female Today's Date: 04/25/2021   PT End of Session - 04/25/21 1244     Visit Number 1    Number of Visits 12    Date for PT Re-Evaluation 06/06/21    Authorization Type [facility]    PT Start Time 1235    PT Stop Time 1315    PT Time Calculation (min) 40 min    Activity Tolerance Patient tolerated treatment well    Behavior During Therapy [facility] for tasks assessed/performed             Past Medical History:  Diagnosis Date   Chronic constipation    Goiter    Hyperprolactinemia ([place])    Irregular periods    Past Surgical History:  Procedure Laterality Date   CESAREAN SECTION N/A 02/15/2017   Procedure: CESAREAN SECTION;  Surgeon: Sherlyn Hay, DO;  Location: [place];  Service: Obstetrics;  Laterality: N/A;   NO PAST SURGERIES     Patient Active Problem List   Diagnosis Date Noted   Arrest of dilation, delivered, current hospitalization 02/15/2017   Status post primary low transverse cesarean section 02/15/2017   Postpartum care following cesarean delivery 02/14/2017   Patellar subluxation 04/15/2011    PCP: Sherlyn Hay, DO  REFERRING PROVIDER: Inez Catalina, MD  REFERRING DIAG: knee pain   THERAPY DIAG:  Acute pain of right knee  Other symptoms and signs involving the musculoskeletal system  ONSET DATE: < 3 mos   SUBJECTIVE:   SUBJECTIVE STATEMENT: I have knee pain, it is getting better but I have been exercising it, walking. She has pain with leg press, squatting, sitting cross legged on the floor and has stiffness when rising after sitting for a period of time.  Pain increases with stairs at work.  She had swelling but it is no longer.  She had a brace but no longer uses it.  She had a L knee injury before and was told she had issues with her patella.  It feels like it will lock when standing up from  the floor. She has not had injection.  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     PERTINENT HISTORY: L knee patellar subluxation  PAIN:  Are you having pain? Yes: NPRS scale: 2/10 Pain location: Rt superior patella  Pain description: sharp  Aggravating factors: squatting Relieving factors: rest, ice, positioning   PRECAUTIONS: None  WEIGHT BEARING RESTRICTIONS No  FALLS:  Has patient fallen in last 6 months? No  LIVING ENVIRONMENT: Lives with: lives with their son Lives in: House/apartment Stairs: Yes: Internal: 12 steps; on right going up Has following equipment at home:  none  and None  OCCUPATION: full time [employer]  PLOF: Independent and Leisure: has 42 yr old son   PATIENT GOALS Pt would like to be pain free , stronger leg    OBJECTIVE:   DIAGNOSTIC FINDINGS: XR not avaibl.   PATIENT SURVEYS:  FOTO 61%  COGNITION:  Overall cognitive status: Within functional limits for tasks assessed     SENSATION: WFL  MUSCLE LENGTH: Hamstrings:tight Thomas test: tight quads bilateral. Pain with passive Rt knee flexion   POSTURE:  Unremarkable, increased lordosis in lumbar spine  PALPATION: Pain with palpation to patella and superiorly  LE ROM:  Active ROM Right 04/25/2021 Left 04/25/2021  Hip flexion    Hip extension    Hip abduction    Hip adduction    Hip internal rotation    Hip external rotation    Knee flexion 3 0  Knee extension 120 125  Ankle dorsiflexion    Ankle plantarflexion    Ankle inversion    Ankle eversion     (Blank rows = not tested)  LE MMT:  MMT Right 04/25/2021 Left 04/25/2021  Hip flexion 4+ 4+  Hip extension 4+ 4+  Hip abduction 5 5  Hip adduction 4 4  Hip internal rotation    Hip external rotation    Knee flexion 5 5  Knee extension 4+ 5  Ankle dorsiflexion    Ankle plantarflexion    Ankle inversion    Ankle eversion     (Blank rows = not  tested)  LOWER EXTREMITY SPECIAL TESTS:  Knee special tests: Patellafemoral grind test: positive   FUNCTIONAL TESTS:  SLS  WNL Step up NT Step down NT Squat : no pain, crepitus bilateral, slight valgus with squat return to neutral/standing   GAIT: Distance walked: 150 Assistive device utilized: None Level of assistance: Complete Independence Comments: no deviations     TODAY'S TREATMENT: Eval and HEP established.  Further functional testing TBA    PATIENT EDUCATION:  Education details: PT/POC, HEP, LE alignment, differential diagnosis Person educated: Patient Education method: Explanation, Demonstration, and Handouts Education comprehension: verbalized understanding and returned demonstration   HOME EXERCISE PROGRAM: Access Code: [facility] URL: [email or web address] Date: 04/25/2021 Prepared by: Raeford Razor  Exercises - Supine Active Straight Leg Raise  - 1 x daily - 7 x weekly - 2 sets - 10 reps - 5 hold - Straight Leg Raise with External Rotation  - 1 x daily - 7 x weekly - 2 sets - 10 reps - 5 hold - Supine Bridge  - 1 x daily - 7 x weekly - 2 sets - 10 reps - 5 hold - Supine Hamstring Stretch with Strap  - 1 x daily - 7 x weekly - 1 sets - 5 reps - 30 hold - Prone Quadriceps Stretch with Strap  - 1 x daily - 7 x weekly - 1 sets - 5 reps - 30 hold  ASSESSMENT:  CLINICAL IMPRESSION: Patient is a 31 y.o. female who was seen today for physical therapy evaluation and treatment for R tknee pain which has been present for a 2-3 mos. No maltracking observed with knee flexion.  Pos. Patellar grind test indicating cartilage issue.  She has patellar irregularity on L knee and perhaps her Rt knee has the same issue.    OBJECTIVE IMPAIRMENTS decreased mobility, difficulty walking, decreased ROM, decreased strength, increased edema, increased fascial restrictions, impaired flexibility, and pain.   ACTIVITY LIMITATIONS cleaning, community activity, occupation,  school, and fitness .   PERSONAL FACTORS 1 comorbidity: previous L knee injury   are also affecting patient's functional outcome.    REHAB POTENTIAL: Excellent  CLINICAL DECISION MAKING: Stable/uncomplicated  EVALUATION COMPLEXITY: Low   GOALS: Goals reviewed with patient? Yes  LONG TERM GOALS: Target date: 06/06/2021  Pt will be I with HEP for knee and hip strength, flexibility  Baseline:  Goal status: INITIAL  2.  Pt will be able to get up from the floor without using her hands  Baseline:  Goal status: INITIAL  3.  Pt will be able to squat in the gym without knee pain and proper form  Baseline:  Goal status: INITIAL  4.  Pt will be able to report no pain increase with walking stairs in the hospital /work Baseline:  Goal status: INITIAL  5.  FOTO score will improve to 80% or more to demo improved functional mobility  Baseline:  Goal status: INITIAL  PLAN: PT FREQUENCY: 1x/week  PT DURATION: 6 weeks  PLANNED INTERVENTIONS: Therapeutic exercises, Therapeutic activity, Neuromuscular re-education, Balance training, Gait training, Patient/Family education, Joint mobilization, Cryotherapy, Moist heat, Taping, Ionotophoresis 38m/ml Dexamethasone, and Manual therapy  PLAN FOR NEXT SESSION: check HEP and advance.  Closed chain hip and knee as tol.    Tallen Schnorr, PT 04/25/2021, 2:52 PM   PHYSICAL THERAPY DISCHARGE SUMMARY  Visits from Start of Care: 1  Current functional level related to goals / functional outcomes: none   Remaining deficits: none   Education / Equipment: HEP   Patient agrees to discharge. Patient goals were not met. Patient is being discharged due to not returning since the last visit.  JRaeford Razor PT 06/11/21 8:46 AM Phone: [phone]: [phone]

## 2021-04-27 ENCOUNTER — Ambulatory Visit: Payer: 59

## 2021-04-29 DIAGNOSIS — H6983 Other specified disorders of Eustachian tube, bilateral: Secondary | ICD-10-CM | POA: Diagnosis not present

## 2021-04-29 DIAGNOSIS — J309 Allergic rhinitis, unspecified: Secondary | ICD-10-CM | POA: Diagnosis not present

## 2021-05-02 ENCOUNTER — Ambulatory Visit: Payer: 59

## 2021-05-09 ENCOUNTER — Ambulatory Visit: Payer: 59

## 2021-05-16 ENCOUNTER — Ambulatory Visit: Payer: 59

## 2021-05-23 ENCOUNTER — Ambulatory Visit: Payer: 59 | Admitting: Physical Therapy

## 2021-08-15 DIAGNOSIS — Z1389 Encounter for screening for other disorder: Secondary | ICD-10-CM | POA: Diagnosis not present

## 2021-08-15 DIAGNOSIS — Z01419 Encounter for gynecological examination (general) (routine) without abnormal findings: Secondary | ICD-10-CM | POA: Diagnosis not present

## 2021-08-15 DIAGNOSIS — D259 Leiomyoma of uterus, unspecified: Secondary | ICD-10-CM | POA: Diagnosis not present

## 2021-08-15 DIAGNOSIS — Z130 Encounter for screening for diseases of the blood and blood-forming organs and certain disorders involving the immune mechanism: Secondary | ICD-10-CM | POA: Diagnosis not present

## 2021-08-28 IMAGING — US US BREAST*L* LIMITED INC AXILLA
1 series · 6 of 6 positions shown · non-contrast
Comparison: None

CLINICAL DATA: 29-year-old patient had some retroareolar left
breast pain during her last menstrual cycle. Due to a sensation of
pressure behind the nipple she applied manual pressure and saw milky
discharge from the left nipple. She denies any bloody nipple
discharge or spontaneous nipple discharge, and denies any palpable
lump. She denied any signs of infection. Currently, she is
asymptomatic.

EXAM:
ULTRASOUND OF THE LEFT BREAST

[Series 1: us breast*left* limited inc axilla · 0.06mm/px · 6 of 6 slices shown]
[im 1/6]
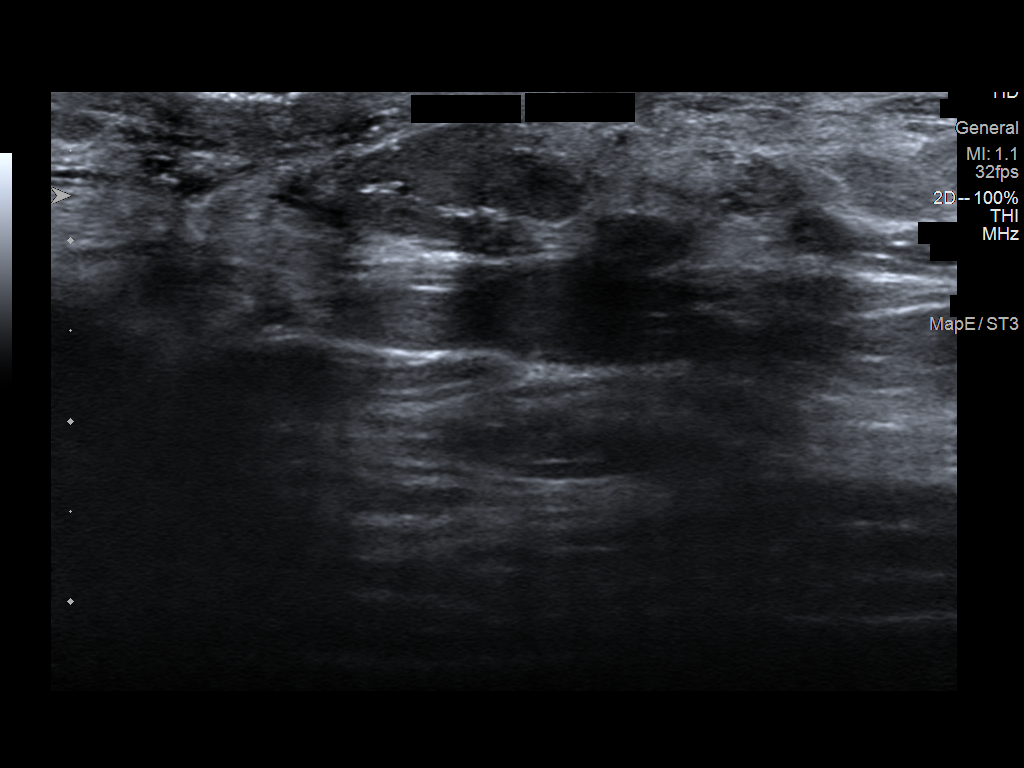
[im 2/6]
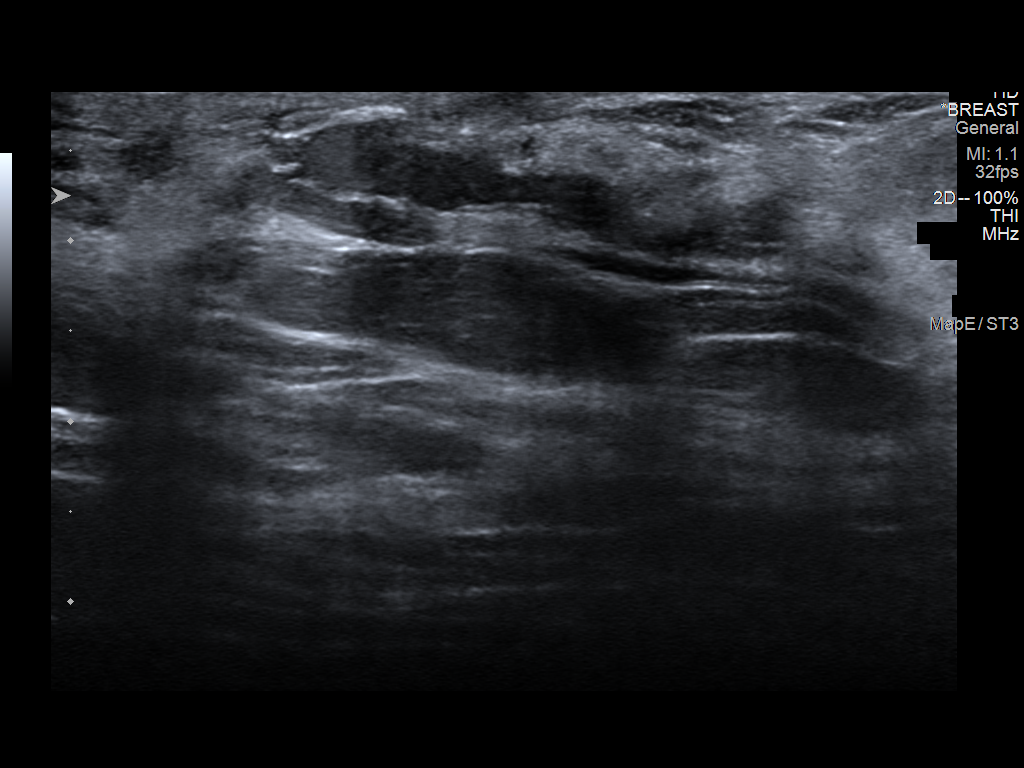
[im 3/6]
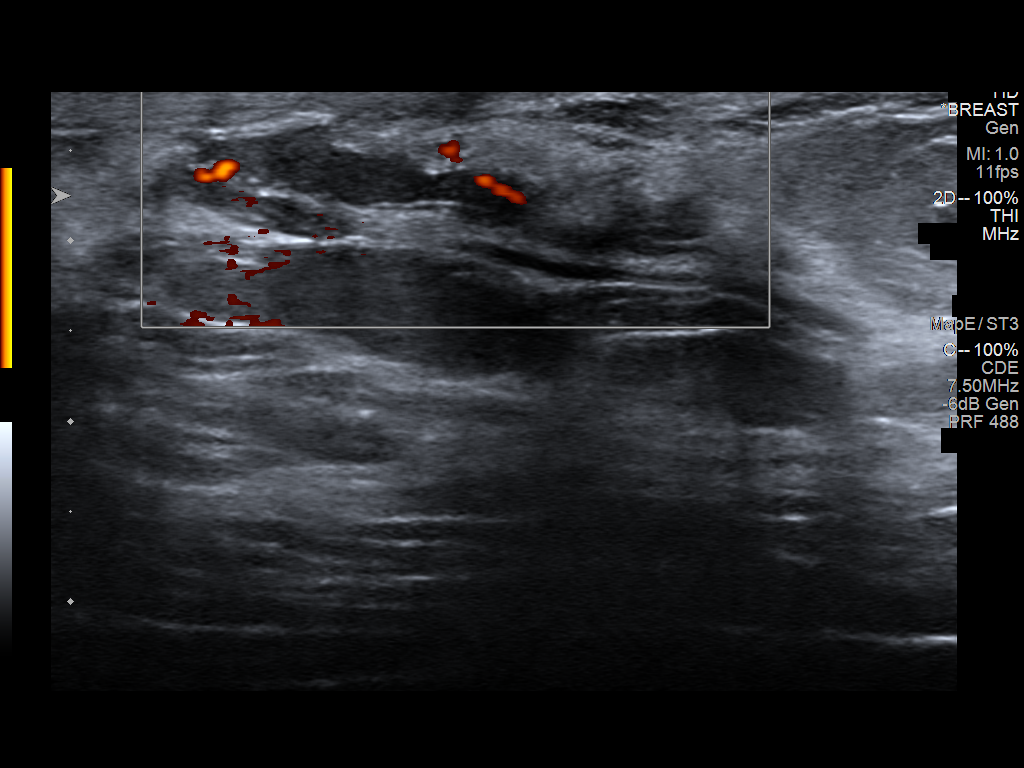
[im 4/6]
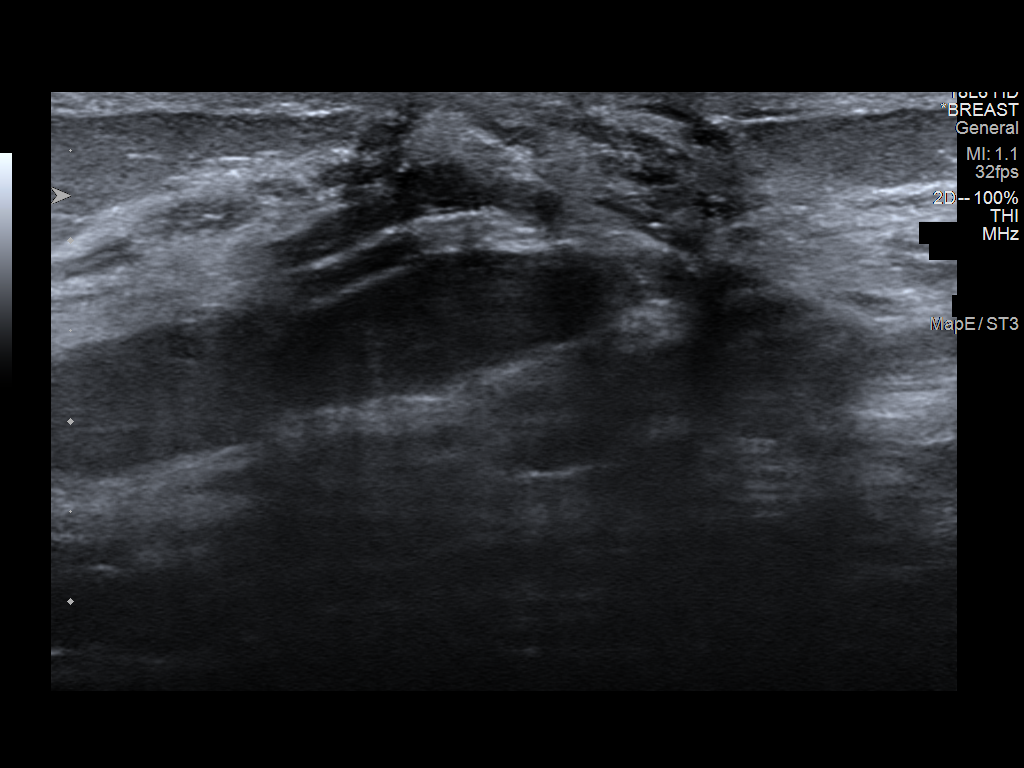
[im 5/6]
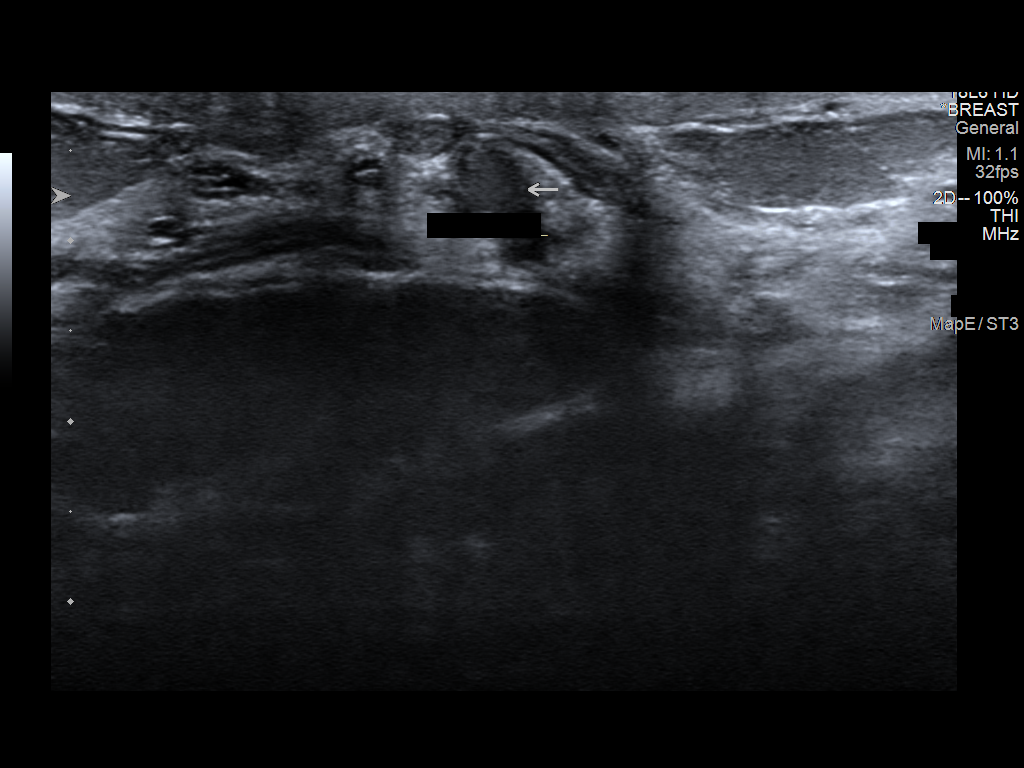
[im 6/6]
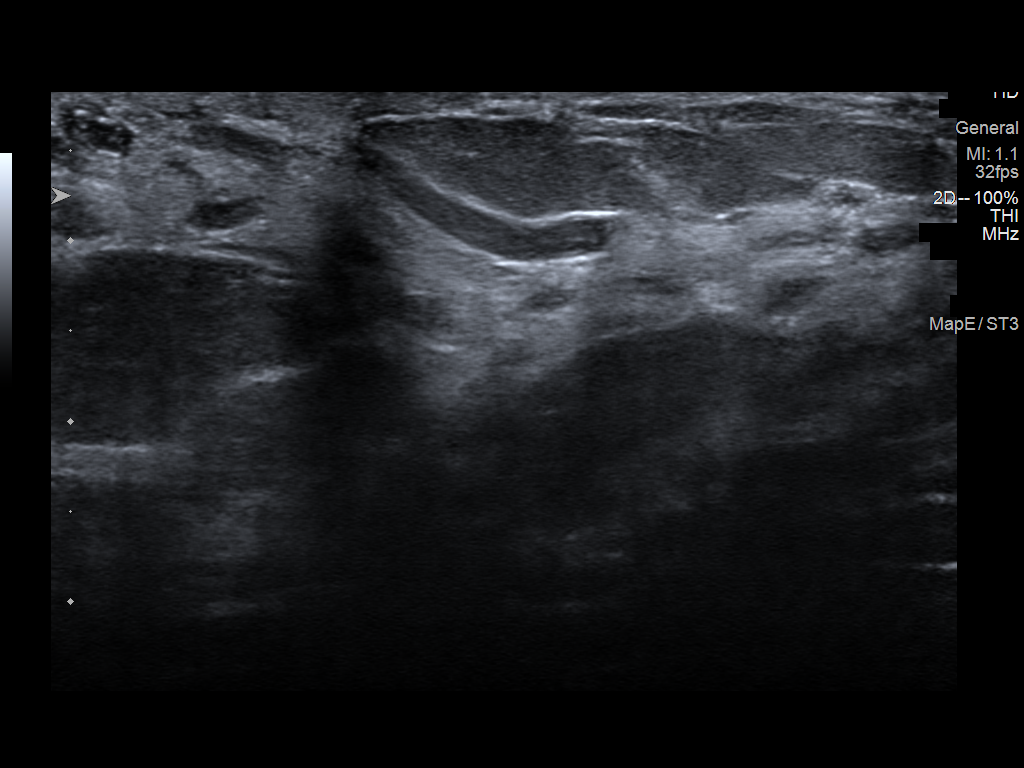

[6 of 6 positions shown; findings below may reference images not displayed]

FINDINGS: On physical exam, skin of the left breast and nipple areolar complex
appears normal. I am able to elicit milky discharge from one duct
orifice and yellowish discharge from a second duct orifice with
manual compression. No bloody nipple discharge.

Targeted ultrasound is performed, showing some mobile debris within
retroareolar ducts. No intraductal or intraparenchymal mass is
identified. Negative for cyst or abnormal shadowing.
IMPRESSION: Benign multi-duct left nipple discharge which occurs only when
pressure is applied. Mobile debris within retroareolar left breast
ducts.

RECOMMENDATION:
Screening mammogram at age 41 unless there are persistent or
intervening clinical concerns. (Code:[id number]-Q-CK6)

I have discussed the findings and recommendations with the patient.
If applicable, a reminder letter will be sent to the patient
regarding the next appointment.

BI-RADS CATEGORY  2: Benign.

## 2021-09-18 ENCOUNTER — Telehealth: Payer: 59 | Admitting: Physician Assistant

## 2021-09-18 ENCOUNTER — Encounter: Payer: Self-pay | Admitting: Physician Assistant

## 2021-09-18 DIAGNOSIS — J00 Acute nasopharyngitis [common cold]: Secondary | ICD-10-CM

## 2021-09-18 DIAGNOSIS — R6889 Other general symptoms and signs: Secondary | ICD-10-CM | POA: Diagnosis not present

## 2021-09-18 MED ORDER — COVID-19 AT HOME ANTIGEN TEST VI KIT: PACK | 0 refills | Status: DC

## 2021-09-18 NOTE — Addendum Note (Signed)
Addended by: Brunetta Jeans on: 09/18/2021 03:04 PM   Modules accepted: Orders

## 2021-09-18 NOTE — Patient Instructions (Signed)
  Stacy Stevenson, thank you for joining [name], PA-C for today's virtual visit.  While this provider is not your primary care provider (PCP), if your PCP is located in our provider database this encounter information will be shared with them immediately following your visit.  Consent: (Patient) Stacy Stevenson provided verbal consent for this virtual visit at the beginning of the encounter.  Current Medications:  Current Outpatient Medications:    acetaminophen (TYLENOL) 325 MG tablet, Take 2 tablets (650 mg total) by mouth every 4 (four) hours as needed (for pain scale < 4  OR  temperature  >/=  100.5 F)., Disp: 30 tablet, Rfl: 1   ibuprofen (ADVIL,MOTRIN) 600 MG tablet, Take 1 tablet (600 mg total) by mouth every 6 (six) hours., Disp: 30 tablet, Rfl: 0   Prenatal Vit-Fe Fumarate-FA (PRENATAL MULTIVITAMIN) TABS tablet, Take 1 tablet by mouth daily at 12 noon. (Patient not taking: Reported on 04/25/2021), Disp: , Rfl:    Medications ordered in this encounter:  No orders of the defined types were placed in this encounter.    *If you need refills on other medications prior to your next appointment, please contact your pharmacy*  Follow-Up: Call back or seek an in-person evaluation if the symptoms worsen or if the condition fails to improve as anticipated.  Other Instructions Please keep hydrated and rest. Repeat COVID test tomorrow morning and message me with results. For now start OTC tylenol Cold and Sinus. Consider restarting a Flonase nasal spray. Quarantine at home until repeat COVID test has resulted and we have determined next steps.   If you have been instructed to have an in-person evaluation today at a local Urgent Care facility, please use the link below. It will take you to a list of all of our available [facility], including address, phone number and hours of operation. Please do not delay care.  [facility]  If you or a family member  do not have a primary care provider, use the link below to schedule a visit and establish care. When you choose a [facility] primary care physician or advanced practice provider, you gain a long-term partner in health. Find a Primary Care Provider  Learn more about [facility]'s in-office and virtual care options: [facility]

## 2021-09-18 NOTE — Progress Notes (Signed)
Virtual Visit Consent   Stacy Stevenson, you are scheduled for a virtual visit with a [facility] provider today. Just as with appointments in the office, your consent must be obtained to participate. Your consent will be active for this visit and any virtual visit you may have with one of our providers in the next 365 days. If you have a MyChart account, a copy of this consent can be sent to you electronically.  As this is a virtual visit, video technology does not allow for your provider to perform a traditional examination. This may limit your provider's ability to fully assess your condition. If your provider identifies any concerns that need to be evaluated in person or the need to arrange testing (such as labs, EKG, etc.), we will make arrangements to do so. Although advances in technology are sophisticated, we cannot ensure that it will always work on either your end or our end. If the connection with a video visit is poor, the visit may have to be switched to a telephone visit. With either a video or telephone visit, we are not always able to ensure that we have a secure connection.  By engaging in this virtual visit, you consent to the provision of healthcare and authorize for your insurance to be billed (if applicable) for the services provided during this visit. Depending on your insurance coverage, you may receive a charge related to this service.  I need to obtain your verbal consent now. Are you willing to proceed with your visit today? Stacy Stevenson has provided verbal consent on 09/18/2021 for a virtual visit (video or telephone). Stacy Stevenson, [place]  Date: 09/18/2021 2:49 PM  Virtual Visit via Video Note   I, Stacy Stevenson, connected with  Stacy Stevenson  ([id number], Mar 06, 1990) on 09/18/21 at  2:30 PM EDT by a video-enabled telemedicine application and verified that I am speaking with the correct person using two identifiers.  Location: Patient: Virtual Visit Location  Patient: Home Provider: Virtual Visit Location Provider: Home Office   I discussed the limitations of evaluation and management by telemedicine and the availability of in person appointments. The patient expressed understanding and agreed to proceed.    History of Present Illness: Stacy Stevenson is a 31 y.o. who identifies as a female who was assigned female at birth, and is being seen today for sudden onset of fever, aches and cold symptoms yesterday evening. Notes today without fever but aches and congestion have continued. COVID test last night was negative. Denies recent travel. 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 Denies chest pain or SOB.  HPI: HPI  Problems:  Patient Active Problem List   Diagnosis Date Noted   Arrest of dilation, delivered, current hospitalization 02/15/2017   Status post primary low transverse cesarean section 02/15/2017   Postpartum care following cesarean delivery 02/14/2017   Patellar subluxation 04/15/2011    Allergies: No Known Allergies Medications:  Current Outpatient Medications:    HAILEY 24 FE 1-20 MG-MCG(24) tablet, Take 1 tablet by mouth daily., Disp: , Rfl:   Observations/Objective: Patient is well-developed, well-nourished in no acute distress.  Resting comfortably at home.  Head is normocephalic, atraumatic.  No labored breathing. Speech is clear and coherent with logical content.  Patient is alert and oriented at baseline.   Assessment and Plan: 1. Flu-like symptoms  Initial COVID test negative. Will have her quarantine and repeat in 24 hours. If positive will treat accordingly. If negative, will discuss  Tamiflu. Supportive measures, OTC medications and vitamin recommendations reviewed.   Follow Up Instructions: I discussed the assessment and treatment plan with the patient. The patient was provided an opportunity to ask questions and all were answered. The patient agreed with the plan and demonstrated an understanding  of the instructions.  A copy of instructions were sent to the patient via MyChart unless otherwise noted below.   The patient was advised to call back or seek an in-person evaluation if the symptoms worsen or if the condition fails to improve as anticipated.  Time:  I spent 10 minutes with the patient via telehealth technology discussing the above problems/concerns.    Stacy Rio, PA-C

## 2021-09-19 MED ORDER — OSELTAMIVIR PHOSPHATE 75 MG PO CAPS: 75.0000 mg | ORAL_CAPSULE | Freq: Two times a day (BID) | ORAL | 0 refills | Status: DC

## 2021-10-04 ENCOUNTER — Telehealth: Payer: 59 | Admitting: Emergency Medicine

## 2021-10-04 DIAGNOSIS — J329 Chronic sinusitis, unspecified: Secondary | ICD-10-CM

## 2021-10-04 DIAGNOSIS — R519 Headache, unspecified: Secondary | ICD-10-CM | POA: Diagnosis not present

## 2021-10-04 MED ORDER — DIPHENHYDRAMINE HCL 25 MG PO TABS: 25.0000 mg | ORAL_TABLET | Freq: Four times a day (QID) | ORAL | 0 refills | Status: DC | PRN

## 2021-10-04 MED ORDER — IPRATROPIUM BROMIDE 0.03 % NA SOLN: 2.0000 | Freq: Two times a day (BID) | NASAL | 0 refills | Status: DC

## 2021-10-04 MED ORDER — IBUPROFEN 800 MG PO TABS: 800.0000 mg | ORAL_TABLET | Freq: Three times a day (TID) | ORAL | 0 refills | Status: DC

## 2021-10-04 MED ORDER — METOCLOPRAMIDE HCL 10 MG PO TABS: 10.0000 mg | ORAL_TABLET | Freq: Four times a day (QID) | ORAL | 0 refills | Status: DC

## 2021-10-04 NOTE — Patient Instructions (Signed)
I recommend that you take the following for your headache:  1 tablet Ibuprofen 800 mg 1 tablet Benadryl 25 mg 1 tablet Reglan 10 mg  Take them together with a large glass of water and then go lie down in a dark quiet room and try to sleep.  This should break the headache.  If your headaches continue, you should be seen by your doctor or a headache specialist.

## 2021-10-04 NOTE — Progress Notes (Signed)
Virtual Visit Consent   [name], you are scheduled for a virtual visit with a [facility] provider today. Just as with appointments in the office, your consent must be obtained to participate. Your consent will be active for this visit and any virtual visit you may have with one of our providers in the next 365 days. If you have a MyChart account, a copy of this consent can be sent to you electronically.  As this is a virtual visit, video technology does not allow for your provider to perform a traditional examination. This may limit your provider's ability to fully assess your condition. If your provider identifies any concerns that need to be evaluated in person or the need to arrange testing (such as labs, EKG, etc.), we will make arrangements to do so. Although advances in technology are sophisticated, we cannot ensure that it will always work on either your end or our end. If the connection with a video visit is poor, the visit may have to be switched to a telephone visit. With either a video or telephone visit, we are not always able to ensure that we have a secure connection.  By engaging in this virtual visit, you consent to the provision of healthcare and authorize for your insurance to be billed (if applicable) for the services provided during this visit. Depending on your insurance coverage, you may receive a charge related to this service.  I need to obtain your verbal consent now. Are you willing to proceed with your visit today? Stacy Stevenson has provided verbal consent on 10/04/2021 for a virtual visit (video or telephone). Montine Circle, PA-C  Date: 10/04/2021 11:46 AM  Virtual Visit via Video Note   I, Montine Circle, connected with  Stacy Stevenson  ([id number], Jan 16, 1990) on 10/04/21 at 11:45 AM EDT by a video-enabled telemedicine application and verified that I am speaking with the correct person using two identifiers.  Location: Patient: Virtual Visit Location  Patient: Mobile Provider: Virtual Visit Location Provider: Home   I discussed the limitations of evaluation and management by telemedicine and the availability of in person appointments. The patient expressed understanding and agreed to proceed.    History of Present Illness: Stacy Stevenson is a 31 y.o. who identifies as a female who was assigned female at birth, and is being seen today for headache.  States that she woke up with a migraine last night.  Reports that the pain is behind right eye.  Doesn't have a history of migraines.  Denies any fever or other symptoms.  States that she has tried Excedrin.  HPI: HPI  Problems:  Patient Active Problem List   Diagnosis Date Noted   Arrest of dilation, delivered, current hospitalization 02/15/2017   Status post primary low transverse cesarean section 02/15/2017   Postpartum care following cesarean delivery 02/14/2017   Patellar subluxation 04/15/2011    Allergies: No Known Allergies Medications:  Current Outpatient Medications:    COVID-19 At Home Antigen Test KIT, Use as directed., Disp: 1 kit, Rfl: 0   HAILEY 24 FE 1-20 MG-MCG(24) tablet, Take 1 tablet by mouth daily., Disp: , Rfl:    ipratropium (ATROVENT) 0.03 % nasal spray, Place 2 sprays into both nostrils every 12 (twelve) hours., Disp: 30 mL, Rfl: 0   oseltamivir (TAMIFLU) 75 MG capsule, Take 1 capsule (75 mg total) by mouth 2 (two) times daily., Disp: 10 capsule, Rfl: 0  Observations/Objective: Patient is well-developed, well-nourished in no acute distress.   Head  is normocephalic, atraumatic.  No labored breathing.  Speech is clear and coherent with logical content.  Patient is alert and oriented at baseline.    Assessment and Plan: 1. Nonintractable headache, unspecified chronicity pattern, unspecified headache type   Modified headache cocktail: 1 tablet Ibuprofen 800 mg 1 tablet Benadryl 25 mg 1 tablet Reglan 10 mg  If your headaches continue, you should be seen by  your doctor or a headache specialist.  Follow Up Instructions: I discussed the assessment and treatment plan with the patient. The patient was provided an opportunity to ask questions and all were answered. The patient agreed with the plan and demonstrated an understanding of the instructions.  A copy of instructions were sent to the patient via MyChart unless otherwise noted below.     The patient was advised to call back or seek an in-person evaluation if the symptoms worsen or if the condition fails to improve as anticipated.  Time:  I spent 10 minutes with the patient via telehealth technology discussing the above problems/concerns.    Montine Circle, PA-C

## 2021-10-04 NOTE — Progress Notes (Signed)
E-Visit for Sinus Problems  We are sorry that you are not feeling well.  Here is how we plan to help!  Based on what you have shared with me it looks like you have sinusitis.  Sinusitis is inflammation and infection in the sinus cavities of the head.  Based on your presentation I believe you most likely have Acute Viral Sinusitis.This is an infection most likely caused by a virus. There is not specific treatment for viral sinusitis other than to help you with the symptoms until the infection runs its course.  You may use an oral decongestant such as Mucinex D or if you have glaucoma or high blood pressure use plain Mucinex. Saline nasal spray help and can safely be used as often as needed for congestion, I have prescribed: Ipratropium Bromide nasal spray 0.03% 2 sprays in eah nostril 2-3 times a day  Some authorities believe that zinc sprays or the use of Echinacea may shorten the course of your symptoms.  Sinus infections are not as easily transmitted as other respiratory infection, however we still recommend that you avoid close contact with loved ones, especially the very young and elderly.  Remember to wash your hands thoroughly throughout the day as this is the number one way to prevent the spread of infection!  Home Care: Only take medications as instructed by your medical team. Do not take these medications with alcohol. A steam or ultrasonic humidifier can help congestion.  You can place a towel over your head and breathe in the steam from hot water coming from a faucet. Avoid close contacts especially the very young and the elderly. Cover your mouth when you cough or sneeze. Always remember to wash your hands.  Get Help Right Away If: You develop worsening fever or sinus pain. You develop a severe head ache or visual changes. Your symptoms persist after you have completed your treatment plan.  Make sure you Understand these instructions. Will watch your condition. Will get help  right away if you are not doing well or get worse.   Thank you for choosing an e-visit.  Your e-visit answers were reviewed by a board certified advanced clinical practitioner to complete your personal care plan. Depending upon the condition, your plan could have included both over the counter or prescription medications.  Please review your pharmacy choice. Make sure the pharmacy is open so you can pick up prescription now. If there is a problem, you may contact your provider through MyChart messaging and have the prescription routed to another pharmacy.  Your safety is important to us. If you have drug allergies check your prescription carefully.   For the next 24 hours you can use MyChart to ask questions about today's visit, request a non-urgent call back, or ask for a work or school excuse. You will get an email in the next two days asking about your experience. I hope that your e-visit has been valuable and will speed your recovery.  Approximately 5 minutes was used in reviewing the patient's chart, questionnaire, prescribing medications, and documentation.  

## 2021-10-14 DIAGNOSIS — D259 Leiomyoma of uterus, unspecified: Secondary | ICD-10-CM | POA: Diagnosis not present

## 2021-10-14 DIAGNOSIS — Z319 Encounter for procreative management, unspecified: Secondary | ICD-10-CM | POA: Diagnosis not present

## 2021-11-12 ENCOUNTER — Other Ambulatory Visit (HOSPITAL_COMMUNITY): Payer: Self-pay

## 2021-11-12 MED ORDER — HAILEY 24 FE 1-20 MG-MCG(24) PO TABS
1.0000 | ORAL_TABLET | Freq: Every day | ORAL | 0 refills | Status: DC
  Filled 2021-11-12: qty 84, 84d supply, fill #0

## 2021-11-13 ENCOUNTER — Other Ambulatory Visit (HOSPITAL_COMMUNITY): Payer: Self-pay

## 2022-01-28 DIAGNOSIS — N644 Mastodynia: Secondary | ICD-10-CM | POA: Diagnosis not present

## 2022-01-29 ENCOUNTER — Encounter: Payer: Self-pay | Admitting: Obstetrics and Gynecology

## 2022-01-29 ENCOUNTER — Other Ambulatory Visit: Payer: Self-pay | Admitting: Obstetrics and Gynecology

## 2022-01-29 DIAGNOSIS — N644 Mastodynia: Secondary | ICD-10-CM

## 2022-01-31 ENCOUNTER — Other Ambulatory Visit (HOSPITAL_COMMUNITY): Payer: Self-pay

## 2022-02-03 ENCOUNTER — Other Ambulatory Visit (HOSPITAL_COMMUNITY): Payer: Self-pay

## 2022-02-03 MED ORDER — BLISOVI 24 FE 1-20 MG-MCG(24) PO TABS
1.0000 | ORAL_TABLET | Freq: Every day | ORAL | 0 refills | Status: DC
  Filled 2022-02-03: qty 84, 84d supply, fill #0

## 2022-02-06 ENCOUNTER — Ambulatory Visit
Admission: RE | Admit: 2022-02-06 | Discharge: 2022-02-06 | Disposition: A | Discharge status: Home / Self Care | Payer: Medicaid Other | Source: Ambulatory Visit | Attending: Obstetrics and Gynecology | Admitting: Obstetrics and Gynecology

## 2022-02-06 ENCOUNTER — Ambulatory Visit: Payer: Commercial Managed Care - PPO

## 2022-02-06 DIAGNOSIS — N644 Mastodynia: Secondary | ICD-10-CM

## 2022-02-11 DIAGNOSIS — N926 Irregular menstruation, unspecified: Secondary | ICD-10-CM | POA: Diagnosis not present

## 2022-02-24 ENCOUNTER — Telehealth: Payer: Self-pay

## 2022-02-24 NOTE — Telephone Encounter (Signed)
Mychart msg sent. AS, CMA

## 2022-03-03 DIAGNOSIS — Z01812 Encounter for preprocedural laboratory examination: Secondary | ICD-10-CM | POA: Diagnosis not present

## 2022-03-04 ENCOUNTER — Encounter (HOSPITAL_BASED_OUTPATIENT_CLINIC_OR_DEPARTMENT_OTHER): Payer: Self-pay | Admitting: Obstetrics and Gynecology

## 2022-03-04 NOTE — Progress Notes (Signed)
Spoke w/ via phone for pre-op interview--- Stacy Stevenson Lab needs dos----UPT per anesthesia. Surgeon orders pending.               Lab results------ COVID test -----patient states asymptomatic no test needed Arrive at -------0820 NPO after MN NO Solid Food.   Med rec completed Medications to take morning of surgery -----Lo-Loestrin Diabetic medication ----- Patient instructed no nail polish to be worn day of surgery Patient instructed to bring photo id and insurance card day of surgery Patient aware to have Driver (ride ) / caregiver Stacy Stevenson   for 24 hours after surgery  Patient Special Instructions ----- Pre-Op special Istructions ----- Patient verbalized understanding of instructions that were given at this phone interview. Patient denies shortness of breath, chest pain, fever, cough at this phone interview.

## 2022-03-11 NOTE — H&P (Signed)
Stacy Stevenson is an 32 y.o.G1P1001 female  here for a hysteroscopic myomectomy for fibroids. Pt has a history of moderately heavy menses. Neg upt.  She would like to have surgery done to enhance chances for future fertility  [place] on 08/2021 showed: multiple fibroids - largest 2cm submucosal; rest smaller intramural; ovarian cyst on L ovary  Hx thyroid disease but no nodules.  Pertinent Gynecological History: Menses: flow is excessive with use of 8+ pads or tampons on heaviest days Bleeding: heavy Contraception: OCP (estrogen/progesterone) DES exposure: denies Blood transfusions: none Sexually transmitted diseases: no past history Previous GYN Procedures:  none    OB History: G1, P1001   Menstrual History: Menarche age: 75 No LMP recorded (lmp unknown).    Past Medical History:  Diagnosis Date   Chronic constipation    Goiter    Hyperprolactinemia ([place])    Irregular periods     Past Surgical History:  Procedure Laterality Date   CESAREAN SECTION N/A 02/15/2017   Procedure: CESAREAN SECTION;  Surgeon: Sherlyn Hay, DO;  Location: [facility];  Service: Obstetrics;  Laterality: N/A;   NO PAST SURGERIES      Family History  Problem Relation Age of Onset   Cancer Maternal Grandmother    Diabetes Maternal Grandmother    Cancer Paternal Grandmother    Diabetes Paternal Grandmother     Social History:  reports that she has never smoked. She has never used smokeless tobacco. She reports current alcohol use. She reports that she does not use drugs.  Allergies: No Known Allergies  No medications prior to admission.    Review of Systems  Constitutional:  Positive for activity change and fatigue.  Eyes:  Negative for visual disturbance.  Respiratory:  Negative for chest tightness and shortness of breath.   Cardiovascular:  Negative for chest pain, palpitations and leg swelling.  Gastrointestinal:  Negative for abdominal pain.  Genitourinary:  Positive for  menstrual problem, pelvic pain and vaginal bleeding.  Musculoskeletal:  Negative for back pain.  Neurological:  Negative for light-headedness and headaches.  Psychiatric/Behavioral:  The patient is nervous/anxious.     Height '5\' 3"'$  (1.6 m), weight 83 kg, unknown if currently breastfeeding. Physical Exam Vitals and nursing note reviewed. Exam conducted with a chaperone present.  Constitutional:      Appearance: Normal appearance. She is normal weight.  Cardiovascular:     Rate and Rhythm: Normal rate.     Pulses: Normal pulses.     Heart sounds: Normal heart sounds.  Pulmonary:     Effort: Pulmonary effort is normal.  Abdominal:     Palpations: Abdomen is soft.     Tenderness: There is no guarding.  Genitourinary:    General: Normal vulva.     Rectum: Normal.  Musculoskeletal:        General: Normal range of motion.     Cervical back: Normal range of motion.  Skin:    General: Skin is warm and dry.     Capillary Refill: Capillary refill takes 2 to 3 seconds.  Neurological:     General: No focal deficit present.     Mental Status: She is alert and oriented to person, place, and time. Mental status is at baseline.  Psychiatric:        Mood and Affect: Mood normal.        Behavior: Behavior normal.        Thought Content: Thought content normal.        Judgment:  Judgment normal.    No results found for this or any previous visit (from the past 24 hour(s)).  No results found.  Assessment/Plan: 32yo G1P1001 female with menorrhagia due to fibroids here for hysteroscopic myomectomy  - Reviewed expectations in OR and post op - Confirm consent for procedure  - To OR when ready   Venetia Night [name] 03/11/2022, 7:04 PM

## 2022-03-13 ENCOUNTER — Ambulatory Visit (HOSPITAL_BASED_OUTPATIENT_CLINIC_OR_DEPARTMENT_OTHER)
Admission: RE | Admit: 2022-03-13 | Discharge: 2022-03-13 | Disposition: A | Discharge status: Home / Self Care | Payer: Commercial Managed Care - PPO | Attending: Obstetrics and Gynecology | Admitting: Obstetrics and Gynecology

## 2022-03-13 ENCOUNTER — Ambulatory Visit (HOSPITAL_BASED_OUTPATIENT_CLINIC_OR_DEPARTMENT_OTHER): Payer: Commercial Managed Care - PPO | Admitting: Anesthesiology

## 2022-03-13 ENCOUNTER — Other Ambulatory Visit: Payer: Self-pay

## 2022-03-13 ENCOUNTER — Encounter (HOSPITAL_BASED_OUTPATIENT_CLINIC_OR_DEPARTMENT_OTHER): Payer: Self-pay | Admitting: Obstetrics and Gynecology

## 2022-03-13 ENCOUNTER — Encounter (HOSPITAL_BASED_OUTPATIENT_CLINIC_OR_DEPARTMENT_OTHER)
Admission: RE | Disposition: A | Discharge status: Home / Self Care | Payer: Self-pay | Source: Home / Self Care | Attending: Obstetrics and Gynecology

## 2022-03-13 DIAGNOSIS — E079 Disorder of thyroid, unspecified: Secondary | ICD-10-CM | POA: Insufficient documentation

## 2022-03-13 DIAGNOSIS — D250 Submucous leiomyoma of uterus: Secondary | ICD-10-CM

## 2022-03-13 DIAGNOSIS — Z01818 Encounter for other preprocedural examination: Secondary | ICD-10-CM

## 2022-03-13 DIAGNOSIS — N920 Excessive and frequent menstruation with regular cycle: Secondary | ICD-10-CM | POA: Diagnosis not present

## 2022-03-13 DIAGNOSIS — N83292 Other ovarian cyst, left side: Secondary | ICD-10-CM | POA: Diagnosis not present

## 2022-03-13 DIAGNOSIS — D259 Leiomyoma of uterus, unspecified: Secondary | ICD-10-CM | POA: Insufficient documentation

## 2022-03-13 DIAGNOSIS — N858 Other specified noninflammatory disorders of uterus: Secondary | ICD-10-CM | POA: Diagnosis not present

## 2022-03-13 DIAGNOSIS — N840 Polyp of corpus uteri: Secondary | ICD-10-CM | POA: Diagnosis not present

## 2022-03-13 HISTORY — PX: HYSTEROSCOPY WITH D & C: SHX1775

## 2022-03-13 LAB — CBC
HCT: 44.1 % (ref 36.0–46.0)
Hemoglobin: 14.6 g/dL (ref 12.0–15.0)
MCH: 28 pg (ref 26.0–34.0)
MCHC: 33.1 g/dL (ref 30.0–36.0)
MCV: 84.6 fL (ref 80.0–100.0)
Platelets: 376 10*3/uL (ref 150–400)
RBC: 5.21 MIL/uL — ABNORMAL HIGH (ref 3.87–5.11)
RDW: 12.9 % (ref 11.5–15.5)
WBC: 5.5 10*3/uL (ref 4.0–10.5)
nRBC: 0 % (ref 0.0–0.2)

## 2022-03-13 LAB — POCT PREGNANCY, URINE: Preg Test, Ur: NEGATIVE

## 2022-03-13 LAB — TYPE AND SCREEN
ABO/RH(D): O POS
Antibody Screen: NEGATIVE

## 2022-03-13 SURGERY — DILATATION AND CURETTAGE /HYSTEROSCOPY
Anesthesia: General | Site: Vagina

## 2022-03-13 MED ORDER — OXYCODONE HCL 5 MG PO TABS: 5.0000 mg | ORAL_TABLET | ORAL | Status: DC | PRN

## 2022-03-13 MED ORDER — ONDANSETRON HCL 4 MG/2ML IJ SOLN: 4.0000 mg | Freq: Four times a day (QID) | INTRAMUSCULAR | Status: DC | PRN

## 2022-03-13 MED ORDER — LACTATED RINGERS IV SOLN: INTRAVENOUS | Status: DC

## 2022-03-13 MED ORDER — PROPOFOL 10 MG/ML IV BOLUS
INTRAVENOUS | Status: AC
  Filled 2022-03-13: qty 20

## 2022-03-13 MED ORDER — FENTANYL CITRATE (PF) 100 MCG/2ML IJ SOLN
INTRAMUSCULAR | Status: DC | PRN
  Administered 2022-03-13 (×2): 25 ug via INTRAVENOUS

## 2022-03-13 MED ORDER — ACETAMINOPHEN 500 MG PO TABS
ORAL_TABLET | ORAL | Status: AC
  Filled 2022-03-13: qty 2

## 2022-03-13 MED ORDER — MIDAZOLAM HCL 2 MG/2ML IJ SOLN
INTRAMUSCULAR | Status: AC
  Filled 2022-03-13: qty 2

## 2022-03-13 MED ORDER — FENTANYL CITRATE (PF) 100 MCG/2ML IJ SOLN: 25.0000 ug | INTRAMUSCULAR | Status: DC | PRN

## 2022-03-13 MED ORDER — ACETAMINOPHEN 500 MG PO TABS
1000.0000 mg | ORAL_TABLET | ORAL | Status: AC
  Administered 2022-03-13: 1000 mg via ORAL

## 2022-03-13 MED ORDER — POVIDONE-IODINE 10 % EX SWAB: 2.0000 | Freq: Once | CUTANEOUS | Status: DC

## 2022-03-13 MED ORDER — OXYCODONE HCL 5 MG PO TABS: 5.0000 mg | ORAL_TABLET | Freq: Once | ORAL | Status: DC | PRN

## 2022-03-13 MED ORDER — FENTANYL CITRATE (PF) 100 MCG/2ML IJ SOLN
INTRAMUSCULAR | Status: AC
  Filled 2022-03-13: qty 2

## 2022-03-13 MED ORDER — KETOROLAC TROMETHAMINE 30 MG/ML IJ SOLN: 30.0000 mg | Freq: Once | INTRAMUSCULAR | Status: DC

## 2022-03-13 MED ORDER — DEXAMETHASONE SODIUM PHOSPHATE 10 MG/ML IJ SOLN
INTRAMUSCULAR | Status: AC
  Filled 2022-03-13: qty 1

## 2022-03-13 MED ORDER — ONDANSETRON HCL 4 MG/2ML IJ SOLN
INTRAMUSCULAR | Status: DC | PRN
  Administered 2022-03-13: 4 mg via INTRAVENOUS

## 2022-03-13 MED ORDER — IBUPROFEN 200 MG PO TABS: 600.0000 mg | ORAL_TABLET | Freq: Four times a day (QID) | ORAL | Status: DC

## 2022-03-13 MED ORDER — PROPOFOL 10 MG/ML IV BOLUS
INTRAVENOUS | Status: DC | PRN
  Administered 2022-03-13: 180 mg via INTRAVENOUS

## 2022-03-13 MED ORDER — DEXMEDETOMIDINE HCL IN NACL 80 MCG/20ML IV SOLN
INTRAVENOUS | Status: DC | PRN
  Administered 2022-03-13: 8 ug via BUCCAL

## 2022-03-13 MED ORDER — SODIUM CHLORIDE 0.9 % IR SOLN
Status: DC | PRN
  Administered 2022-03-13: 3000 mL

## 2022-03-13 MED ORDER — MIDAZOLAM HCL 5 MG/5ML IJ SOLN
INTRAMUSCULAR | Status: DC | PRN
  Administered 2022-03-13: 2 mg via INTRAVENOUS

## 2022-03-13 MED ORDER — PROMETHAZINE HCL 25 MG/ML IJ SOLN: 6.2500 mg | INTRAMUSCULAR | Status: DC | PRN

## 2022-03-13 MED ORDER — CELECOXIB 200 MG PO CAPS
ORAL_CAPSULE | ORAL | Status: AC
  Filled 2022-03-13: qty 1

## 2022-03-13 MED ORDER — KETOROLAC TROMETHAMINE 30 MG/ML IJ SOLN
INTRAMUSCULAR | Status: DC | PRN
  Administered 2022-03-13: 30 mg via INTRAVENOUS

## 2022-03-13 MED ORDER — LIDOCAINE 2% (20 MG/ML) 5 ML SYRINGE
INTRAMUSCULAR | Status: DC | PRN
  Administered 2022-03-13: 80 mg via INTRAVENOUS

## 2022-03-13 MED ORDER — LIDOCAINE HCL 1 % IJ SOLN
INTRAMUSCULAR | Status: DC | PRN
  Administered 2022-03-13: 10 mL

## 2022-03-13 MED ORDER — OXYCODONE HCL 5 MG/5ML PO SOLN: 5.0000 mg | Freq: Once | ORAL | Status: DC | PRN

## 2022-03-13 MED ORDER — IBUPROFEN 600 MG PO TABS: 600.0000 mg | ORAL_TABLET | Freq: Four times a day (QID) | ORAL | 0 refills | Status: DC | PRN

## 2022-03-13 MED ORDER — LIDOCAINE HCL (PF) 2 % IJ SOLN
INTRAMUSCULAR | Status: AC
  Filled 2022-03-13: qty 5

## 2022-03-13 MED ORDER — SIMETHICONE 80 MG PO CHEW: 80.0000 mg | CHEWABLE_TABLET | Freq: Four times a day (QID) | ORAL | Status: DC | PRN

## 2022-03-13 MED ORDER — DEXAMETHASONE SODIUM PHOSPHATE 10 MG/ML IJ SOLN
INTRAMUSCULAR | Status: DC | PRN
  Administered 2022-03-13: 10 mg via INTRAVENOUS

## 2022-03-13 MED ORDER — ONDANSETRON HCL 4 MG/2ML IJ SOLN
INTRAMUSCULAR | Status: AC
  Filled 2022-03-13: qty 2

## 2022-03-13 MED ORDER — ACETAMINOPHEN 500 MG PO TABS: 1000.0000 mg | ORAL_TABLET | Freq: Four times a day (QID) | ORAL | Status: DC

## 2022-03-13 MED ORDER — CELECOXIB 200 MG PO CAPS
200.0000 mg | ORAL_CAPSULE | Freq: Once | ORAL | Status: AC
  Administered 2022-03-13: 200 mg via ORAL

## 2022-03-13 MED ORDER — ONDANSETRON HCL 4 MG PO TABS: 4.0000 mg | ORAL_TABLET | Freq: Four times a day (QID) | ORAL | Status: DC | PRN

## 2022-03-13 MED ORDER — ACETAMINOPHEN 500 MG PO TABS: 1000.0000 mg | ORAL_TABLET | Freq: Once | ORAL | Status: DC

## 2022-03-13 MED ORDER — OXYCODONE-ACETAMINOPHEN 5-325 MG PO TABS: 1.0000 | ORAL_TABLET | ORAL | 0 refills | Status: AC | PRN

## 2022-03-13 SURGICAL SUPPLY — 15 items
CATH ROBINSON RED A/P 16FR (CATHETERS) ×1 IMPLANT
DEVICE MYOSURE REACH (MISCELLANEOUS) IMPLANT
DILATOR CANAL MILEX (MISCELLANEOUS) IMPLANT
DRSG TELFA 3X8 NADH STRL (GAUZE/BANDAGES/DRESSINGS) ×1 IMPLANT
GAUZE 4X4 16PLY ~~LOC~~+RFID DBL (SPONGE) ×1 IMPLANT
GLOVE BIO SURGEON STRL SZ 6.5 (GLOVE) ×1 IMPLANT
GOWN STRL REUS W/TWL LRG LVL3 (GOWN DISPOSABLE) ×2 IMPLANT
IV NS IRRIG 3000ML ARTHROMATIC (IV SOLUTION) ×1 IMPLANT
KIT PROCEDURE FLUENT (KITS) ×1 IMPLANT
KIT TURNOVER CYSTO (KITS) ×1 IMPLANT
PACK VAGINAL MINOR WOMEN LF (CUSTOM PROCEDURE TRAY) ×1 IMPLANT
PAD OB MATERNITY 4.3X12.25 (PERSONAL CARE ITEMS) ×1 IMPLANT
SEAL ROD LENS SCOPE MYOSURE (ABLATOR) ×1 IMPLANT
SLEEVE SCD COMPRESS KNEE MED (STOCKING) ×1 IMPLANT
TOWEL OR 17X24 6PK STRL BLUE (TOWEL DISPOSABLE) ×2 IMPLANT

## 2022-03-13 NOTE — Anesthesia Preprocedure Evaluation (Addendum)
Anesthesia Evaluation  Patient identified by MRN, date of birth, ID band Patient awake    Reviewed: Allergy & Precautions, NPO status , Patient's Chart, lab work & pertinent test results  History of Anesthesia Complications Negative for: history of anesthetic complications  Airway Mallampati: III  TM Distance: >3 FB Neck ROM: Full    Dental  (+) Dental Advisory Given  Braces :   Pulmonary neg pulmonary ROS   Pulmonary exam normal        Cardiovascular negative cardio ROS Normal cardiovascular exam     Neuro/Psych negative neurological ROS  negative psych ROS   GI/Hepatic negative GI ROS, Neg liver ROS,,,  Endo/Other   Obesity   Renal/GU negative Renal ROS     Musculoskeletal negative musculoskeletal ROS (+)    Abdominal   Peds  Hematology negative hematology ROS (+)   Anesthesia Other Findings   Reproductive/Obstetrics  uterine leiomyoma                              Anesthesia Physical Anesthesia Plan  ASA: 2  Anesthesia Plan: General   Post-op Pain Management: Tylenol PO (pre-op)* and Celebrex PO (pre-op)*   Induction: Intravenous  PONV Risk Score and Plan: 3 and Treatment may vary due to age or medical condition and Ondansetron  Airway Management Planned: LMA  Additional Equipment: None  Intra-op Plan:   Post-operative Plan: Extubation in OR  Informed Consent: I have reviewed the patients History and Physical, chart, labs and discussed the procedure including the risks, benefits and alternatives for the proposed anesthesia with the patient or authorized representative who has indicated his/her understanding and acceptance.     Dental advisory given  Plan Discussed with: CRNA and Anesthesiologist  Anesthesia Plan Comments:         Anesthesia Quick Evaluation

## 2022-03-13 NOTE — Discharge Instructions (Addendum)
Call office with any concerns [phone]   Post Anesthesia Home Care Instructions  Activity: Get plenty of rest for the remainder of the day. A responsible individual must stay with you for 24 hours following the procedure.  For the next 24 hours, DO NOT: -Drive a car -Paediatric nurse -Drink alcoholic beverages -Take any medication unless instructed by your physician -Make any legal decisions or sign important papers.  Meals: Start with liquid foods such as gelatin or soup. Progress to regular foods as tolerated. Avoid greasy, spicy, heavy foods. If nausea and/or vomiting occur, drink only clear liquids until the nausea and/or vomiting subsides. Call your physician if vomiting continues.  Special Instructions/Symptoms: Your throat may feel dry or sore from the anesthesia or the breathing tube placed in your throat during surgery. If this causes discomfort, gargle with warm salt water. The discomfort should disappear within 24 hours.  DISCHARGE INSTRUCTIONS: HYSTEROSCOPY The following instructions have been prepared to help you care for yourself upon your return home.  May take Ibuprofen after 5 PM. May take Tylenol beginning at 3 PM as needed for cramping/soreness.  May take stool softner while taking narcotic pain medication to prevent constipation.  Drink plenty of water.  Personal hygiene:  Use sanitary pads for vaginal drainage, not tampons.  Shower the day after your procedure.  NO tub baths, pools or Jacuzzis for 2-3 weeks.  Wipe front to back after using the bathroom.  Activity and limitations:  Do NOT drive or operate any equipment for 24 hours. The effects of anesthesia are still present and drowsiness may result.  Do NOT rest in bed all day.  Walking is encouraged.  Walk up and down stairs slowly.  You may resume your normal activity in one to two days or as indicated by your physician. Sexual activity: NO intercourse for at least 2 weeks after the procedure,  or as indicated by your Doctor.  Diet: Eat a light meal as desired this evening. You may resume your usual diet tomorrow.  Return to Work: You may resume your work activities in one to two days or as indicated by Marine scientist.  What to expect after your surgery: Expect to have vaginal bleeding/discharge for 2-3 days and spotting for up to 10 days. It is not unusual to have soreness for up to 1-2 weeks. You may have a slight burning sensation when you urinate for the first day. Mild cramps may continue for a couple of days. You may have a regular period in 2-6 weeks.  Call your doctor for any of the following:  Excessive vaginal bleeding or clotting, saturating and changing one pad every hour.  Inability to urinate 6 hours after discharge from hospital.  Pain not relieved by pain medication.  Fever of 100.4 F or greater.  Unusual vaginal discharge or odor.

## 2022-03-13 NOTE — Anesthesia Postprocedure Evaluation (Signed)
Anesthesia Post Note  Patient: Stacy Stevenson  Procedure(s) Performed: HYSTEROSCOPY, MYOMECTOMY (Vagina )     Patient location during evaluation: PACU Anesthesia Type: General Level of consciousness: awake and alert Pain management: pain level controlled Vital Signs Assessment: post-procedure vital signs reviewed and stable Respiratory status: spontaneous breathing, nonlabored ventilation and respiratory function stable Cardiovascular status: stable and blood pressure returned to baseline Anesthetic complications: no   No notable events documented.  Last Vitals:  Vitals:   03/13/22 1115 03/13/22 1130  BP: 121/71 (!) 102/59  Pulse: 85 80  Resp: (!) 22 13  Temp:    SpO2: 100% 100%    Last Pain:  Vitals:   03/13/22 1130  TempSrc:   PainSc: 0-No pain                 Audry Pili

## 2022-03-13 NOTE — Transfer of Care (Signed)
Immediate Anesthesia Transfer of Care Note  Patient: Stacy Stevenson  Procedure(s) Performed: HYSTEROSCOPY, MYOMECTOMY (Vagina )  Patient Location: PACU  Anesthesia Type:General  Level of Consciousness: awake, alert , and oriented  Airway & Oxygen Therapy: Patient Spontanous Breathing and Patient connected to nasal cannula oxygen  Post-op Assessment: Report given to RN and Post -op Vital signs reviewed and stable  Post vital signs: Reviewed and stable  Last Vitals:  Vitals Value Taken Time  BP 122/75 03/13/22 1103  Temp 36.8 C 03/13/22 1103  Pulse 78 03/13/22 1104  Resp 14 03/13/22 1104  SpO2 99 % 03/13/22 1104  Vitals shown include unvalidated device data.  Last Pain:  Vitals:   03/13/22 0849  TempSrc: Oral      Patients Stated Pain Goal: 5 ([id number])  Complications: No notable events documented.

## 2022-03-13 NOTE — Anesthesia Procedure Notes (Signed)
Procedure Name: LMA Insertion Date/Time: 03/13/2022 10:24 AM  Performed by: Kymora Sciara D, CRNAPre-anesthesia Checklist: Patient identified, Emergency Drugs available, Suction available and Patient being monitored Patient Re-evaluated:Patient Re-evaluated prior to induction Oxygen Delivery Method: Circle system utilized Preoxygenation: Pre-oxygenation with 100% oxygen Induction Type: IV induction Ventilation: Mask ventilation without difficulty LMA: LMA inserted LMA Size: 4.0 Tube type: Oral Number of attempts: 1 Placement Confirmation: positive ETCO2 and breath sounds checked- equal and bilateral Tube secured with: Tape Dental Injury: Teeth and Oropharynx as per pre-operative assessment

## 2022-03-13 NOTE — Op Note (Signed)
Operative Note    Preoperative Diagnosis Uterine fibroid Menorrhagia    Postoperative Diagnosis Uterine polyp vs fibroid Menorrhagia   Procedure: Hysteroscopy dilation and curettage with myosure removal of uterine mass ( suspected fibroid)   Surgeon: Terri Piedra, C DO   Anesthesia: General   Fluids: LR 457m EBL: 186mUOP: voided prior to OR  Fluid deficit 17032mFindings: 2-3cm posterior fundal mass, suspected fibroid vs polyp. Grossly normal secretory phase endometrium, Both ostia in normal anatomic position   Specimen: Endometrial curettings and polyp /fibroid   Procedure Note Patient was taken to the operating room where general anesthesia was administered without difficulty. She was then prepped and draped in the normal sterile fashion in the dorsal lithotomy position. An appropriate time out was performed.  A speculum was then placed in the vagina. The anterior lip of the cervix was grasped with a single toothed tenaculum and 1% lidocaine plain injected at 3 and 9 o'clock for a paracervical block.  The uterus was then sounded to approximately 8 cm and the [facility], Thelators utilized to dilate the cervix up to size 19.  The Myosure operating scope was then introduced into the uterine cavity. The cavity was noted to have a secretory phase endometrium and a posterior fundal mass measuring about 2-3cm noted. Both ostia were in normal anatomic position.  Next, using the myosure reach, the mass was removed in its entirety. A light gentle curettage in all four quadrants was then performed. There was no active bleeding noted. The samples were handed off to be sent to pathology.  At this time, all instruments were removed. The tenaculum site was noted to be hemostatic.  Finally the speculum was removed from the vagina and the patient awakened and taken to the recovery room in stable condition.   All counts were noted to be correct x 2

## 2022-03-14 ENCOUNTER — Encounter (HOSPITAL_BASED_OUTPATIENT_CLINIC_OR_DEPARTMENT_OTHER): Payer: Self-pay | Admitting: Obstetrics and Gynecology

## 2022-03-14 LAB — SURGICAL PATHOLOGY

## 2022-04-27 ENCOUNTER — Ambulatory Visit
Admission: EM | Admit: 2022-04-27 | Discharge: 2022-04-27 | Disposition: A | Discharge status: Home / Self Care | Payer: Commercial Managed Care - PPO

## 2022-04-27 ENCOUNTER — Telehealth: Payer: Commercial Managed Care - PPO

## 2022-04-27 DIAGNOSIS — G44209 Tension-type headache, unspecified, not intractable: Secondary | ICD-10-CM | POA: Diagnosis not present

## 2022-04-27 DIAGNOSIS — R109 Unspecified abdominal pain: Secondary | ICD-10-CM | POA: Diagnosis not present

## 2022-04-27 DIAGNOSIS — R198 Other specified symptoms and signs involving the digestive system and abdomen: Secondary | ICD-10-CM | POA: Diagnosis not present

## 2022-04-27 DIAGNOSIS — K279 Peptic ulcer, site unspecified, unspecified as acute or chronic, without hemorrhage or perforation: Secondary | ICD-10-CM | POA: Diagnosis not present

## 2022-04-27 DIAGNOSIS — R1084 Generalized abdominal pain: Secondary | ICD-10-CM | POA: Diagnosis not present

## 2022-04-27 DIAGNOSIS — Z3A31 31 weeks gestation of pregnancy: Secondary | ICD-10-CM | POA: Diagnosis not present

## 2022-04-27 DIAGNOSIS — R0781 Pleurodynia: Secondary | ICD-10-CM | POA: Diagnosis not present

## 2022-04-27 DIAGNOSIS — R1032 Left lower quadrant pain: Secondary | ICD-10-CM | POA: Diagnosis not present

## 2022-04-27 DIAGNOSIS — O30033 Twin pregnancy, monochorionic/diamniotic, third trimester: Secondary | ICD-10-CM | POA: Diagnosis not present

## 2022-05-06 DIAGNOSIS — N920 Excessive and frequent menstruation with regular cycle: Secondary | ICD-10-CM | POA: Diagnosis not present

## 2022-05-06 DIAGNOSIS — Z23 Encounter for immunization: Secondary | ICD-10-CM | POA: Diagnosis not present

## 2022-05-06 DIAGNOSIS — Z1331 Encounter for screening for depression: Secondary | ICD-10-CM | POA: Diagnosis not present

## 2022-05-06 DIAGNOSIS — Z1330 Encounter for screening examination for mental health and behavioral disorders, unspecified: Secondary | ICD-10-CM | POA: Diagnosis not present

## 2022-05-06 DIAGNOSIS — Z0000 Encounter for general adult medical examination without abnormal findings: Secondary | ICD-10-CM | POA: Diagnosis not present

## 2022-05-09 DIAGNOSIS — J029 Acute pharyngitis, unspecified: Secondary | ICD-10-CM | POA: Diagnosis not present

## 2022-05-09 DIAGNOSIS — M791 Myalgia, unspecified site: Secondary | ICD-10-CM | POA: Diagnosis not present

## 2022-05-09 DIAGNOSIS — J069 Acute upper respiratory infection, unspecified: Secondary | ICD-10-CM | POA: Diagnosis not present

## 2022-05-09 DIAGNOSIS — R051 Acute cough: Secondary | ICD-10-CM | POA: Diagnosis not present

## 2022-06-04 ENCOUNTER — Ambulatory Visit: Payer: Commercial Managed Care - PPO | Admitting: Nurse Practitioner

## 2022-08-18 ENCOUNTER — Other Ambulatory Visit: Payer: Self-pay | Admitting: Oncology

## 2022-08-18 DIAGNOSIS — Z006 Encounter for examination for normal comparison and control in clinical research program: Secondary | ICD-10-CM

## 2022-10-29 ENCOUNTER — Other Ambulatory Visit
Admission: RE | Admit: 2022-10-29 | Discharge: 2022-10-29 | Disposition: A | Discharge status: Home / Self Care | Payer: Medicaid Other | Source: Ambulatory Visit | Attending: Oncology | Admitting: Oncology

## 2022-10-29 DIAGNOSIS — Z006 Encounter for examination for normal comparison and control in clinical research program: Secondary | ICD-10-CM | POA: Insufficient documentation

## 2022-11-04 ENCOUNTER — Other Ambulatory Visit: Payer: Medicaid Other

## 2022-11-09 ENCOUNTER — Other Ambulatory Visit: Payer: Medicaid Other

## 2022-11-10 LAB — HELIX MOLECULAR SCREEN: Genetic Analysis Overall Interpretation: NEGATIVE

## 2022-11-10 LAB — GENECONNECT MOLECULAR SCREEN

## 2023-11-12 ENCOUNTER — Ambulatory Visit (HOSPITAL_BASED_OUTPATIENT_CLINIC_OR_DEPARTMENT_OTHER): Admitting: Pulmonary Disease

## 2023-11-12 ENCOUNTER — Encounter (HOSPITAL_BASED_OUTPATIENT_CLINIC_OR_DEPARTMENT_OTHER): Payer: Self-pay | Admitting: Pulmonary Disease

## 2023-11-12 VITALS — BP 112/81 | HR 80 | Ht 64.0 in | Wt 178.4 lb

## 2023-11-12 DIAGNOSIS — R053 Chronic cough: Secondary | ICD-10-CM

## 2023-11-12 NOTE — Patient Instructions (Signed)
  Post-Nasal Drainage Treatment       --Recommend Cetirizine 10 mg daily (Zyrtec)       --Flonase 2 sprays per nare daily         Reflux Treatment       --Recommend Omeprazole 20 mg daily       --Avoid late meals, caffeine, alcohol, spicy food and overeating.         Possible cough-variant asthma Treatment       --Pulmonary function tests. Will call with results

## 2023-11-12 NOTE — Progress Notes (Signed)
 Subjective:   PATIENT ID: Stacy Stevenson GENDER: female DOB: 01-Sep-1990, MRN: [id number]  Chief Complaint  Patient presents with   Cough   Consult    Consult cough    Reason for Visit: New consult for chronic cough  Ms. Lear Carstens is 33 year old female  never smoker with no significant past medical history who presents for chronic cough evaluation.  She reports nonproductive cough with unclear etiology starting 3 months ago. No preceding illness. No associated wheezing or shortness of breath. Cough occurs randomly including with laying down. Tried pantoprazole daily for one month. Tried allergy medications for 3-4 weeks. Denies history of allergies but had childhood mold exposure. Dogs sometimes on bed but sleeps separately. Symptoms are not worse at night. No other persons in household with similar symptoms. No family history of asthma. Had similar cough prior during pregnancy >6 years ago. Denies frequent bronchitis.   Social History: Never smoker Never vaper Pets - dogs indoors  I have personally reviewed patient's past medical/family/social history, allergies, current medications.  Past Medical History:  Diagnosis Date   Chronic constipation    Goiter    Hyperprolactinemia    Irregular periods      Family History  Problem Relation Age of Onset   Cancer Maternal Grandmother    Diabetes Maternal Grandmother    Cancer Paternal Grandmother    Diabetes Paternal Grandmother      Social History   Occupational History   Occupation: Psychologist, [employer]  Tobacco Use   Smoking status: Never    Passive exposure: Never   Smokeless tobacco: Never  Vaping Use   Vaping status: Never Used  Substance and Sexual Activity   Alcohol use: Yes    Comment: wine, liquor   Drug use: No   Sexual activity: Yes    Birth control/protection: Pill    No Known Allergies   Outpatient Medications Prior to Visit  Medication Sig Dispense Refill   ipratropium (ATROVENT ) 0.03 % nasal spray  Place 2 sprays into both nostrils every 12 (twelve) hours. 30 mL 0   diphenhydrAMINE  (BENADRYL  ALLERGY) 25 MG tablet Take 1 tablet (25 mg total) by mouth every 6 (six) hours as needed. 5 tablet 0   HAILEY  24 FE 1-20 MG-MCG(24) tablet Take 1 tablet by mouth daily.     ibuprofen  (ADVIL ) 600 MG tablet Take 1 tablet (600 mg total) by mouth every 6 (six) hours as needed for moderate pain or cramping. 20 tablet 0   No facility-administered medications prior to visit.    Review of Systems  Constitutional:  Negative for chills, diaphoresis, fever, malaise/fatigue and weight loss.  HENT:  Negative for congestion.   Respiratory:  Positive for cough. Negative for hemoptysis, sputum production, shortness of breath and wheezing.   Cardiovascular:  Negative for chest pain, palpitations and leg swelling.     Objective:   Vitals:   11/12/23 0907  BP: 112/81  Pulse: 80  SpO2: 100%  Weight: 178 lb 6.4 oz (80.9 kg)  Height: 5' 4 (1.626 m)   SpO2: 100 %  Physical Exam: General: Well-appearing, no acute distress HENT: [place], AT Eyes: EOMI, no scleral icterus Respiratory: Clear to auscultation bilaterally.  No crackles, wheezing or rales Cardiovascular: RRR, -M/R/G, no JVD Extremities:-Edema,-tenderness Neuro: AAO x4, CNII-XII grossly intact Psych: Normal mood, normal affect  Data Reviewed:  Imaging: CXR 04/07/16 - Chronic bronchitis  PFT: None on file  Labs: CBC    Component Value Date/Time   WBC 5.5  03/13/2022 0904   RBC 5.21 (H) 03/13/2022 0904   HGB 14.6 03/13/2022 0904   HCT 44.1 03/13/2022 0904   PLT 376 03/13/2022 0904   MCV 84.6 03/13/2022 0904   MCH 28.0 03/13/2022 0904   MCHC 33.1 03/13/2022 0904   RDW 12.9 03/13/2022 0904   No diff available     Assessment & Plan:   Discussion: 33 year old female never smoker who presents for chronic cough x 3 months. Brief trial of PPI and allergy meds. Will rule out obstructive lung disease. If PFTs negative will recommend longer  trial of prior treatment.  Chronic Cough Multiple causes for cough are possible including uncontrolled asthma, acid reflux and post-nasal drainage. We recommend the following to address these issues.   Post-Nasal Drainage Treatment       --Recommend Cetirizine 10 mg daily (Zyrtec)       --Flonase 2 sprays per nare daily         Reflux Treatment       --Recommend Omeprazole 20 mg daily       --Avoid late meals, caffeine, alcohol, spicy food and overeating.         Possible cough-variant asthma Treatment       --Pulmonary function tests. Will call with results   Health Maintenance Immunization History  Administered Date(s) Administered   Fluzone Influenza virus vaccine,trivalent (IIV3), split virus 10/03/2015, 10/10/2019, 10/08/2020   HPV 9-valent 09/19/2022, 11/24/2022, 05/25/2023   Influenza-Unspecified 09/29/2016, 10/07/2022   PFIZER(Purple Top)SARS-COV-2 Vaccination 03/17/2019, 04/08/2019   PPD Test 02/04/2022   Tdap 05/06/2022   CT Lung Screen - not qualified  Orders Placed This Encounter  Procedures   Pulmonary function test    Standing Status:   Future    Expiration Date:   11/11/2024    Where should this test be performed?:   Outpatient Pulmonary    What type of PFT is being ordered?:   Full PFT  No orders of the defined types were placed in this encounter.   No follow-ups on file. Schedule pending PFT results   Danniel Tones Slater Staff, MD [facility] 11/12/2023 11:38 AM

## 2023-11-25 ENCOUNTER — Ambulatory Visit (INDEPENDENT_AMBULATORY_CARE_PROVIDER_SITE_OTHER)

## 2023-11-25 DIAGNOSIS — R053 Chronic cough: Secondary | ICD-10-CM

## 2023-11-25 LAB — PULMONARY FUNCTION TEST
DL/VA % pred: 114 %
DL/VA: 5.2 ml/min/mmHg/L
DLCO unc % pred: 80 %
DLCO unc: 18.07 ml/min/mmHg
FEF 25-75 Post: 2.82 L/s
FEF 25-75 Pre: 2.73 L/s
FEF2575-%Change-Post: 3 %
FEF2575-%Pred-Post: 82 %
FEF2575-%Pred-Pre: 80 %
FEV1-%Change-Post: 1 %
FEV1-%Pred-Post: 76 %
FEV1-%Pred-Pre: 76 %
FEV1-Post: 2.42 L
FEV1-Pre: 2.4 L
FEV1FVC-%Change-Post: 1 %
FEV1FVC-%Pred-Pre: 101 %
FEV6-%Change-Post: 0 %
FEV6-%Pred-Post: 75 %
FEV6-%Pred-Pre: 75 %
FEV6-Post: 2.81 L
FEV6-Pre: 2.83 L
FEV6FVC-%Pred-Post: 101 %
FEV6FVC-%Pred-Pre: 101 %
FVC-%Change-Post: 0 %
FVC-%Pred-Post: 74 %
FVC-%Pred-Pre: 75 %
FVC-Post: 2.81 L
FVC-Pre: 2.83 L
Post FEV1/FVC ratio: 86 %
Post FEV6/FVC ratio: 100 %
Pre FEV1/FVC ratio: 85 %
Pre FEV6/FVC Ratio: 100 %
RV % pred: 109 %
RV: 1.56 L
TLC % pred: 83 %
TLC: 4.23 L

## 2023-11-25 NOTE — Progress Notes (Signed)
 Full PFT performed today.

## 2023-11-25 NOTE — Patient Instructions (Signed)
 Full PFT performed today.

## 2023-11-30 ENCOUNTER — Telehealth (HOSPITAL_BASED_OUTPATIENT_CLINIC_OR_DEPARTMENT_OTHER): Payer: Self-pay

## 2023-11-30 NOTE — Telephone Encounter (Signed)
 Copied from CRM #[id number]. Topic: Clinical - Lab/Test Results >> Nov 30, 2023  2:32 PM Joesph PARAS wrote: Reason for CRM: Patient is calling to inquire about results of PFT. Please call and inform patient.

## 2023-12-01 ENCOUNTER — Encounter (HOSPITAL_BASED_OUTPATIENT_CLINIC_OR_DEPARTMENT_OTHER): Payer: Self-pay | Admitting: Pulmonary Disease

## 2023-12-01 DIAGNOSIS — R053 Chronic cough: Secondary | ICD-10-CM

## 2023-12-01 MED ORDER — BUDESONIDE-FORMOTEROL FUMARATE 80-4.5 MCG/ACT IN AERO: 2.0000 | INHALATION_SPRAY | Freq: Two times a day (BID) | RESPIRATORY_TRACT | 2 refills | Status: AC

## 2023-12-01 NOTE — Telephone Encounter (Signed)
 11/25/23 FVC 2.81 (74%) FEV1 2.42 (76%) Ratio 86  TLC 83% DLCO 80% Interpretation: Normal PFTs.  No obstructive or restrictive defect.   Cough is unchanged and worsens at night. Offered trial of inhaler vs continuing only PPI and allergy medication.     After discussion on phone, patient would like to trial inhalers.  Brand name Symbicort  80 ordered. Advised to continue pantoprazole 40 mg daily and allerg meds.  Please schedule patient for follow-up with me or PA in 2 months.

## 2023-12-01 NOTE — Telephone Encounter (Signed)
 FYI

## 2023-12-02 NOTE — Telephone Encounter (Signed)
 Spoke with patient who was at work at time of call. She will callback to schedule this f/u. Recall placed.

## 2023-12-08 NOTE — Telephone Encounter (Signed)
 Please advise

## 2023-12-09 ENCOUNTER — Encounter (INDEPENDENT_AMBULATORY_CARE_PROVIDER_SITE_OTHER): Payer: Self-pay

## 2023-12-10 ENCOUNTER — Ambulatory Visit (HOSPITAL_BASED_OUTPATIENT_CLINIC_OR_DEPARTMENT_OTHER)

## 2023-12-10 DIAGNOSIS — R053 Chronic cough: Secondary | ICD-10-CM | POA: Diagnosis not present

## 2023-12-21 NOTE — Progress Notes (Signed)
 [facility] Stacy Stevenson    Date: 12/21/2023 Patient name: Stacy Stevenson Date of birth: [birth year]   ASSESSMENT/PLAN:  Diagnoses and all orders for this visit: Obesity (BMI 30.0-34.9) -     phentermine (ADIPEX-P) 37.5 MG tablet; Take one tablet (37.5 mg dose) by mouth 30 (thirty) minutes before breakfast. Max Daily Amount: 37.5 mg   Assessment & Plan 1. Weight management: Stable. - Informed about the discontinuation of Medicaid coverage for weight loss injections. - Discussed alternatives including Wellbutrin, Topamax, and Qsymia. - Preference for phentermine, previously used successfully. - Prescription for phentermine provided with instructions to take in the morning on an empty stomach. - Discussed potential side effects: dry mouth, constipation, jitteriness. - Advised to increase water intake and limit caffeine consumption. - Emphasized maintaining a healthy lifestyle through diet and exercise. - Cautioned against becoming pregnant while on this medication. - obesity counseling x 15 mins    Follow up in about 4 weeks (around 01/18/2024) for weight check .    Patient expresses understanding of their current medications and use.  We discussed potential side effects, drug interactions, instructions for taking the medication, and the consequences of not taking it. Patient verbalized an understanding of these instructions. Patient is able to verbalize understanding of the care plan discussed today. Patient's medical and personal goals were discussed today.  Computer technology was used to create visit note. Consent from patient/care giver was obtained prior to its use.   Patient was instructed to return sooner if no improvement, and we discussed precautions to seek emergency medical care if condition worsens.    SUBJECTIVE:  Ms. Stacy Stevenson is a 33 y.o. female that presents to clinic today regarding the following issues: Weight Check   History of Present  Illness The patient presents for weight management.  She has expressed interest in discussing her weight management strategies. She reports that her insurance no longer covers the cost of weight loss injections. She has previously used phentermine, which she found beneficial. She is not currently seeking to conceive. She is trying to exercise when she has time. She did a spin class this morning.        Reviewed and updated this visit by provider: Tobacco  Allergies  Meds  Problems  Med Hx  Surg Hx  Fam Hx  PDMP        Medications Patient's Medications  New Prescriptions   PHENTERMINE (ADIPEX-P) 37.5 MG TABLET    Take one tablet (37.5 mg dose) by mouth 30 (thirty) minutes before breakfast. Max Daily Amount: 37.5 mg  Previous Medications   DERMA-SMOOTHE/FS SCALP 0.01 % EXTERNAL OIL    SMARTSIG:sparingly Daily   KETOCONAZOLE (NIZORAL) 2% SHAMPOO    SMARTSIG:Topical Daily   MONTELUKAST (SINGULAIR) 10 MG TABLET    Take one tablet (10 mg dose) by mouth daily.   PANTOPRAZOLE SODIUM (PROTONIX) 40 MG TABLET    Take one tablet (40 mg dose) by mouth daily.   TACROLIMUS (PROTOPIC) 0.1% OINTMENT    Apply once daily to area between thighs  Modified Medications   No medications on file  Discontinued Medications   No medications on file      ROS  Review of Systems  Constitutional: Negative.   HENT: Negative.    Eyes: Negative.   Respiratory: Negative.    Cardiovascular: Negative.   Gastrointestinal: Negative.   Endocrine: Negative.   Genitourinary: Negative.   Musculoskeletal: Negative.   Skin: Negative.   Allergic/Immunologic: Negative.   Neurological: Negative.  Hematological: Negative.   Psychiatric/Behavioral: Negative.       Review of Systems - All other systems reviewed are negative except as noted above.   OBJECTIVE:  Vitals BP 120/82 (Patient Position: Sitting)   Pulse 82   Temp 98.2 F (36.8 C) (Temporal)   Ht 5' 4 (1.626 m)   Wt 184 lb 3.2 oz (83.6  kg)   SpO2 100%   BMI 31.62 kg/m    PHYSICAL:  General Appearance: Alert, oriented and appears in no acute distress. Sitting comfortably in chair. Head: Normocephalic, without obvious abnormality, atraumatic.  Respiratory: Good air movement throughout. No accessory muscle use, increased work of breathing or respiratory distress. Clear to auscultation in all lung fields. Normal effort of breathing, non-labored. No adventitious lung sounds including wheezes, rhonchi or crackles. Cardiovascular: Regular rate and rhythm, S1, S2 normal, no murmur. No heaves, lifts or thrills palpable. Skin: Warm, dry.  Psychiatric: Pleasant. Mood and affect normal. Not anxious or tearful. Neurological: AOx3. Gait normal.     Joesph Stevenson Cedar, PA-C  [facility] [place]  12/21/2023 4:32 PM

## 2024-01-05 ENCOUNTER — Ambulatory Visit (INDEPENDENT_AMBULATORY_CARE_PROVIDER_SITE_OTHER): Admitting: Physician Assistant

## 2024-01-05 ENCOUNTER — Encounter (INDEPENDENT_AMBULATORY_CARE_PROVIDER_SITE_OTHER): Payer: Self-pay | Admitting: Physician Assistant

## 2024-01-05 VITALS — BP 133/84 | HR 96 | Temp 98.5°F | Ht 64.0 in | Wt 180.0 lb

## 2024-01-05 DIAGNOSIS — R053 Chronic cough: Secondary | ICD-10-CM

## 2024-01-05 DIAGNOSIS — K219 Gastro-esophageal reflux disease without esophagitis: Secondary | ICD-10-CM

## 2024-01-05 MED ORDER — LORATADINE 10 MG PO TABS: 10.0000 mg | ORAL_TABLET | Freq: Every day | ORAL | 11 refills | Status: AC

## 2024-01-05 MED ORDER — FLUTICASONE PROPIONATE 50 MCG/ACT NA SUSP: 2.0000 | Freq: Every day | NASAL | 6 refills | Status: AC

## 2024-01-05 NOTE — Progress Notes (Signed)
 Dear Dr. Kassie, Here is my assessment for our mutual patient, Stacy Stevenson. Thank you for allowing me the opportunity to care for your patient. Please do not hesitate to contact me should you have any other questions. Sincerely, Chyrl Cohen PA-C  Otolaryngology Clinic Note Referring provider: Dr. Kassie HPI:  Stacy Stevenson is a 33 y.o. female kindly referred by Dr. Kassie   Discussed the use of AI scribe software for clinical note transcription with the patient, who gave verbal consent to proceed.  History of Present Illness    Stacy Stevenson is a 33 year old female with laryngopharyngeal reflux who presents for evaluation of persistent cough.  She has experienced a chronic cough for approximately five months, described as a tickling sensation in the throat that intermittently provokes coughing episodes. The cough is not constant, and she is asymptomatic at times, but a tickling sensation can trigger coughing. The cough disrupts her sleep.  She reports postnasal drainage, particularly when lying down. She denies chest pain, shortness of breath, fever, and allergies. She does not smoke.  Previous evaluation by pulmonology included a chest x-ray and a trial of albuterol inhaler, which did not provide relief. She completed a short course of antibiotics with transient improvement, but symptoms recurred after two weeks. There was no preceding upper respiratory infection at onset. She recalls a similar cough in the past that resolved during pregnancy.  Current therapies have included intermittent use of Afrin nasal spray, pantoprazole, Prilosec, Symbicort  inhaler, and oral antihistamine. She performs sinus irrigation with saline but does not use Afrin consistently.           Independent Review of Additional Tests or Records:  Telephone encounter 12/01/2023 pulmonology-  11/25/23 FVC 2.81 (74%) FEV1 2.42 (76%) Ratio 86  TLC 83% DLCO 80% Interpretation: Normal PFTs.  No obstructive or  restrictive defect.   Cough is unchanged and worsens at night. Offered trial of inhaler vs continuing only PPI and allergy medication.     After discussion on phone, patient would like to trial inhalers.   Brand name Symbicort  80 ordered. Advised to continue pantoprazole 40 mg daily and allerg meds.   Please schedule patient for follow-up with me or PA in 2 months.   PMH/Meds/All/SocHx/FamHx/ROS:   Past Medical History:  Diagnosis Date   Chronic constipation    Goiter    Hyperprolactinemia    Irregular periods      Past Surgical History:  Procedure Laterality Date   CESAREAN SECTION N/A 02/15/2017   Procedure: CESAREAN SECTION;  Surgeon: Delana Ted Morrison, DO;  Location: [facility];  Service: Obstetrics;  Laterality: N/A;   HYSTEROSCOPY WITH D & C N/A 03/13/2022   Procedure: HYSTEROSCOPY, MYOMECTOMY;  Surgeon: Delana Ted Morrison, DO;  Location: [facility];  Service: Gynecology;  Laterality: N/A;   NO PAST SURGERIES      Family History  Problem Relation Age of Onset   Cancer Maternal Grandmother    Diabetes Maternal Grandmother    Cancer Paternal Grandmother    Diabetes Paternal Grandmother      Social Connections: Socially Integrated (06/08/2023)   Received from [facility]   Social Network    How would you rate your social network (family, work, friends)?: Good participation with social networks     Current Medications[1]   Physical Exam:   BP 133/84   Pulse 96   Temp 98.5 F (36.9 C)   Ht 5' 4 (1.626 m)   Wt 180 lb (81.6 kg)  SpO2 98%   BMI 30.90 kg/m   Pertinent Findings  CN II-XII grossly intact Bilateral EAC clear and TM intact with well pneumatized middle ear spaces Anterior rhinoscopy: Septum left deviation; bilateral inferior turbinates with no hypertrophy No lesions of oral cavity/oropharynx; dentition within normal limits No obviously palpable neck masses/lymphadenopathy/thyromegaly No respiratory distress or  stridor       Seprately Identifiable Procedures:  Procedure Note Pre-procedure diagnosis:  chronic cough Post-procedure diagnosis: Same Procedure: Transnasal Fiberoptic Laryngoscopy, CPT 31575 - Mod 25 Indication: see above Complications: None apparent EBL: 0 mL   The procedure was undertaken to further evaluate the patient's complaint of chronic cough, with mirror exam inadequate for appropriate examination due to gag reflex and poor patient tolerance   Procedure:  Patient was identified as correct patient. Verbal consent was obtained. The nose was sprayed with oxymetazoline and 4% lidocaine . The The flexible laryngoscope was passed through the nose to view the nasal cavity, pharynx (oropharynx, hypopharynx) and larynx.  The larynx was examined at rest and during multiple phonatory tasks. Documentation was obtained and reviewed with patient. The scope was removed. The patient tolerated the procedure well.   Findings: The nasal cavity and nasopharynx did not reveal any masses or lesions, mucosa appeared to be without obvious lesions. The tongue base, pharyngeal walls, piriform sinuses, vallecula, epiglottis and postcricoid region are normal in appearance. The visualized portion of the subglottis and proximal trachea is widely patent. The vocal folds are mobile bilaterally.. There are no lesions on the free edge of the vocal folds nor elsewhere in the larynx worrisome for malignancy. Minor erythema of arytenoids.   Impression & Plans:  Stacy Stevenson is a 33 y.o. female with the following   Assessment and Plan    Chronic cough Chronic cough for five months, likely multifactorial with possible contributions from postnasal drainage, laryngopharyngeal reflux, or post-infectious etiology. Pulmonary and allergy evaluations were unrevealing. Nasal endoscopy showed no significant sinus disease or lesions. Etiology remains unclear. - Performed nasal endoscopy to assess for structural or mucosal  abnormalities. - Recommended daily saline sinus irrigation. - Prescribed daily oral antihistamine (loratadine or cetirizine). - Prescribed daily intranasal corticosteroid (fluticasone). - Advised to trial above therapies for three months and monitor for improvement. - Discussed conditional plan to discontinue therapies sequentially if improvement occurs to identify the most effective intervention. - Advised to continue pulmonary evaluation if symptoms persist after otolaryngology-directed therapies.  Laryngopharyngeal reflux Mild laryngeal irritation on endoscopy consistent with possible laryngopharyngeal reflux. Previous acid suppression therapy alters acidity but does not prevent reflux. Reflux is a likely contributor to chronic cough, particularly with recumbency. - Provided sample of Reflux Gourmet  - Instructed to use Reflux Cormet after meals and before bedtime. - Advised to monitor response and, if improvement occurs, consider tapering other therapies.           - f/u PRN   Thank you for allowing me the opportunity to care for your patient. Please do not hesitate to contact me should you have any other questions.  Sincerely, Chyrl Cohen PA-C [facility] Phone: [phone] Fax: [phone]  01/05/2024, 1:53 PM        [1]  Current Outpatient Medications:    budesonide -formoterol  (SYMBICORT ) 80-4.5 MCG/ACT inhaler, Inhale 2 puffs into the lungs in the morning and at bedtime., Disp: 1 each, Rfl: 2   fluticasone (FLONASE) 50 MCG/ACT nasal spray, Place 2 sprays into both nostrils daily., Disp: 16 g, Rfl: 6   loratadine (CLARITIN) 10 MG  tablet, Take 1 tablet (10 mg total) by mouth daily., Disp: 30 tablet, Rfl: 11   montelukast (SINGULAIR) 4 MG PACK, Take 4 mg by mouth at bedtime., Disp: , Rfl:    pantoprazole (PROTONIX) 40 MG tablet, Take 40 mg by mouth daily., Disp: , Rfl:    phentermine (ADIPEX-P) 37.5 MG tablet, Take 37.5 mg by mouth daily., Disp: , Rfl:     tacrolimus (PROTOPIC) 0.1 % ointment, Apply 1 Application topically daily., Disp: , Rfl:

## 2024-01-28 ENCOUNTER — Ambulatory Visit (HOSPITAL_BASED_OUTPATIENT_CLINIC_OR_DEPARTMENT_OTHER): Admitting: Pulmonary Disease

## 2024-02-09 ENCOUNTER — Telehealth (INDEPENDENT_AMBULATORY_CARE_PROVIDER_SITE_OTHER): Admitting: Pulmonary Disease

## 2024-02-09 ENCOUNTER — Encounter (HOSPITAL_BASED_OUTPATIENT_CLINIC_OR_DEPARTMENT_OTHER): Payer: Self-pay | Admitting: Pulmonary Disease

## 2024-02-09 ENCOUNTER — Ambulatory Visit (HOSPITAL_BASED_OUTPATIENT_CLINIC_OR_DEPARTMENT_OTHER): Admitting: Pulmonary Disease

## 2024-02-09 VITALS — Ht 64.0 in | Wt 182.0 lb

## 2024-02-09 DIAGNOSIS — R053 Chronic cough: Secondary | ICD-10-CM

## 2024-02-09 DIAGNOSIS — K219 Gastro-esophageal reflux disease without esophagitis: Secondary | ICD-10-CM
